# Patient Record
Sex: Female | Born: 1950
Health system: Southern US, Community
[De-identification: ages and names within clinical notes are randomized; demographics above are authoritative.]

## PROBLEM LIST (undated history)

## (undated) DIAGNOSIS — K5792 Diverticulitis of intestine, part unspecified, without perforation or abscess without bleeding: Secondary | ICD-10-CM

## (undated) DIAGNOSIS — J42 Unspecified chronic bronchitis: Secondary | ICD-10-CM

## (undated) DIAGNOSIS — Z720 Tobacco use: Secondary | ICD-10-CM

## (undated) HISTORY — PX: TONSILLECTOMY: SUR1361

## (undated) HISTORY — PX: ABDOMINAL HYSTERECTOMY: SHX81

---

## 2019-08-17 ENCOUNTER — Emergency Department
Admission: EM | Admit: 2019-08-17 | Discharge: 2019-08-17 | Disposition: A | Discharge status: Home / Self Care | Payer: Medicare Other | Attending: Emergency Medicine | Admitting: Emergency Medicine

## 2019-08-17 ENCOUNTER — Emergency Department: Payer: Medicare Other

## 2019-08-17 ENCOUNTER — Other Ambulatory Visit: Payer: Self-pay

## 2019-08-17 DIAGNOSIS — R2241 Localized swelling, mass and lump, right lower limb: Secondary | ICD-10-CM | POA: Diagnosis not present

## 2019-08-17 DIAGNOSIS — F17200 Nicotine dependence, unspecified, uncomplicated: Secondary | ICD-10-CM | POA: Diagnosis not present

## 2019-08-17 DIAGNOSIS — R0602 Shortness of breath: Secondary | ICD-10-CM | POA: Diagnosis not present

## 2019-08-17 DIAGNOSIS — M25561 Pain in right knee: Secondary | ICD-10-CM | POA: Insufficient documentation

## 2019-08-17 LAB — CBC
HCT: 47.3 % — ABNORMAL HIGH (ref 36.0–46.0)
Hemoglobin: 15.6 g/dL — ABNORMAL HIGH (ref 12.0–15.0)
MCH: 30.5 pg (ref 26.0–34.0)
MCHC: 33 g/dL (ref 30.0–36.0)
MCV: 92.6 fL (ref 80.0–100.0)
Platelets: 224 10*3/uL (ref 150–400)
RBC: 5.11 MIL/uL (ref 3.87–5.11)
RDW: 13.3 % (ref 11.5–15.5)
WBC: 7.8 10*3/uL (ref 4.0–10.5)
nRBC: 0 % (ref 0.0–0.2)

## 2019-08-17 LAB — BASIC METABOLIC PANEL
Anion gap: 8 (ref 5–15)
BUN: 12 mg/dL (ref 8–23)
CO2: 31 mmol/L (ref 22–32)
Calcium: 9.1 mg/dL (ref 8.9–10.3)
Chloride: 100 mmol/L (ref 98–111)
Creatinine, Ser: 0.63 mg/dL (ref 0.44–1.00)
GFR calc Af Amer: >60 mL/min (ref >60)
GFR calc non Af Amer: >60 mL/min (ref >60)
Glucose, Bld: 106 mg/dL — ABNORMAL HIGH (ref 70–99)
Potassium: 3.7 mmol/L (ref 3.5–5.1)
Sodium: 139 mmol/L (ref 135–145)

## 2019-08-17 LAB — BRAIN NATRIURETIC PEPTIDE: B Natriuretic Peptide: 54 pg/mL (ref 0.0–100.0)

## 2019-08-17 LAB — TROPONIN I (HIGH SENSITIVITY): Troponin I (High Sensitivity): 17 ng/L (ref <18)

## 2019-08-17 MED ORDER — TRAMADOL HCL 50 MG PO TABS: 50.0000 mg | ORAL_TABLET | Freq: Four times a day (QID) | ORAL | 0 refills | Status: AC | PRN

## 2019-08-17 MED ORDER — FUROSEMIDE 20 MG PO TABS
20.0000 mg | ORAL_TABLET | Freq: Every day | ORAL | 0 refills | Status: AC
Start: 2019-08-17 — End: 2019-08-27

## 2019-08-17 NOTE — Discharge Instructions (Addendum)
You should take ibuprofen (Advil, Motrin) or Tylenol for the knee pain.  You may take the tramadol that was prescribed today if needed for more severe pain.  You should take the furosemide that was prescribed daily for the next 10 days to help with swelling and the shortness of breath.  It is very important that you follow-up with your primary care doctor (we have provided information for [facility] and [facility]).  You should also follow-up with an orthopedist if you continue have pain in the knee.  Return to the ER for new, worsening, or persistent severe pain, swelling, difficulty breathing, weakness, or any other new or worsening symptoms that concern you.

## 2019-08-17 NOTE — ED Triage Notes (Signed)
Pt arrives to ed via pov from home with c/o leg swelling starting last Tuesday, and by Thursday pt states she was unable to bend right leg. Pt denies any injury, or falls. Knee currently covered but reports warmth and swelling, denies any redness. Pt also reports SOB with exertion, reports hx of feeling sob but feels over the last month increased SOB with exertion. Pt a&o x 4, NAD noted at this time. RR even and unlabored at this time.

## 2019-08-17 NOTE — ED Notes (Signed)
Pt alert and oriented X 4, stable for discharge. RR even and unlabored, color WNL. Discussed discharge instructions and follow up when appropriate. Instructed to follow up with ER for any life threatening symptoms or concerns that patient or family of patient may have  

## 2019-08-17 NOTE — ED Provider Notes (Signed)
[facility] Emergency Department Provider Note ____________________________________________   First MD Initiated Contact with Patient 08/17/19 1528     (approximate)  I have reviewed the triage vital signs and the nursing notes.   HISTORY  Chief Complaint Shortness of Breath and Leg Swelling    HPI Brianna Barker is a 69 y.o. female with no significant past medical history who presents with 2 complaints.  Primarily, she reports right knee pain for about the last 6 days.  It started when she turned in bed one night.  It is associated with swelling, and is worse when she tries to bend the knee.  She also reports mild swelling to the left leg which is more chronic.  The patient also complains of shortness of breath, gradual onset over approximately the last month, worse with exertion but not with lying in bed.  The patient mainly sleeps on her side.  She denies associated cough, fever, or chest pain.  She is not on any medications currently other than Tylenol for the knee pain.  History reviewed. No pertinent past medical history.  There are no problems to display for this patient.   Past Surgical History:  Procedure Laterality Date  . ABDOMINAL HYSTERECTOMY    . TONSILLECTOMY      Prior to Admission medications   Medication Sig Start Date End Date Taking? Authorizing Provider  furosemide (LASIX) 20 MG tablet Take 1 tablet (20 mg total) by mouth daily for 10 days. 08/17/19 08/27/19  Arta Silence, MD  traMADol (ULTRAM) 50 MG tablet Take 1 tablet (50 mg total) by mouth every 6 (six) hours as needed for up to 5 days for moderate pain. 08/17/19 08/22/19  Arta Silence, MD    Allergies Aleve [naproxen]  History reviewed. No pertinent family history.  Social History Social History   Tobacco Use  . Smoking status: Current Every Day Smoker  . Smokeless tobacco: Never Used  Substance Use Topics  . Alcohol use: Not Currently  . Drug use: Never     Review of Systems  Constitutional: No fever. Eyes: No redness. ENT: No sore throat. Cardiovascular: Denies chest pain. Respiratory: Positive for shortness of breath. Gastrointestinal: No vomiting or diarrhea.  Genitourinary: Negative for dysuria.  Musculoskeletal: Negative for back pain.  Positive for right knee pain. Skin: Negative for rash. Neurological: Negative for headaches, focal weakness or numbness.   ____________________________________________   PHYSICAL EXAM:  VITAL SIGNS: ED Triage Vitals  Enc Vitals Group     BP 08/17/19 1436 (!) 171/82     Pulse Rate 08/17/19 1436 88     Resp 08/17/19 1436 18     Temp 08/17/19 1436 98 F (36.7 C)     Temp Source 08/17/19 1436 Oral     SpO2 08/17/19 1436 94 %     Weight 08/17/19 1441 160 lb (72.6 kg)     Height 08/17/19 1441 5' (1.524 m)     Head Circumference --      Peak Flow --      Pain Score 08/17/19 1441 4     Pain Loc --      Pain Edu? --      Excl. in [place]? --     Constitutional: Alert and oriented. Well appearing and in no acute distress. Eyes: Conjunctivae are normal.  Head: Atraumatic. Nose: No congestion/rhinnorhea. Mouth/Throat: Mucous membranes are moist.   Neck: Normal range of motion.  Cardiovascular: Normal rate, regular rhythm. Grossly normal heart sounds.  Good peripheral  circulation. Respiratory: Normal respiratory effort.  No retractions.  Scattered wheezing bilaterally. Gastrointestinal: No distention.  Musculoskeletal: Trace left lower extremity edema.  1+ right lower extremity edema below the knee.  Moderate swelling to the knee and mild tenderness posteriorly.  Range of motion of the right knee limited due to pain.  Full extension of the knee intact.  Extremities warm and well perfused.  No erythema, induration, or abnormal warmth. Neurologic:  Normal speech and language.  5/5 motor strength and intact sensation of bilateral lower extremities.   Skin:  Skin is warm and dry. No rash  noted. Psychiatric: Mood and affect are normal. Speech and behavior are normal.  ____________________________________________   LABS (all labs ordered are listed, but only abnormal results are displayed)  Labs Reviewed  BASIC METABOLIC PANEL - Abnormal; Notable for the following components:      Result Value   Glucose, Bld 106 (*)    All other components within normal limits  CBC - Abnormal; Notable for the following components:   Hemoglobin 15.6 (*)    HCT 47.3 (*)    All other components within normal limits  BRAIN NATRIURETIC PEPTIDE  TROPONIN I (HIGH SENSITIVITY)   ____________________________________________  EKG  ED ECG REPORT I, Dionne Bucy, the attending physician, personally viewed and interpreted this ECG.  Date: 08/17/2019 EKG Time: 1451 Rate: 85 Rhythm: normal sinus rhythm with occasional PVCs QRS Axis: normal Intervals: normal ST/T Wave abnormalities: Nonspecific T wave abnormalities Narrative Interpretation: Nonspecific abnormalities with no evidence of acute ischemia  ____________________________________________  RADIOLOGY  CXR: Interstitial opacities bilateral lung bases XR R knee: No acute fracture US venous RLE: No acute DVT.  Decompressed Baker's cyst.  ____________________________________________   PROCEDURES  Procedure(s) performed: No  Procedures  Critical Care performed: No ____________________________________________   INITIAL IMPRESSION / ASSESSMENT AND PLAN / ED COURSE  Pertinent labs & imaging results that were available during my care of the patient were reviewed by me and considered in my medical decision making (see chart for details).  69 year old female with no active medical problems that she is aware of presents with right knee pain over the last week after she feels like she pulled or strained it during the night, as well as shortness of breath over approximately 1 month.  On exam the patient is overall  well-appearing.  Her vital signs are normal except for hypertension.  O2 saturation is in the mid 90s on room air.  She has some tenderness to the right knee especially posteriorly and range of motion limited due to pain, but no cutaneous findings.  There is mild swelling, somewhat worse on the right.  Knee pain: Presentation is most consistent with meniscus tear, sprain, muscle strain, or other musculoskeletal etiology.  We will obtain x-rays, and given the more diffuse swelling in the distal right leg we will obtain an ultrasound to rule out DVT.  Shortness of breath: This is more subacute and gradual.  Differential includes new onset CHF, new onset COPD/asthma, bronchitis, or less likely pneumonia.  We will obtain a chest x-ray and labs.  ----------------------------------------- 5:32 PM on 08/17/2019 -----------------------------------------  X-ray of the knee shows no acute bony findings.  Ultrasound shows no DVT, but there is a small decompressed Baker's cyst.  This and/or torn meniscus are the most likely etiologies of the patient's pain.  I instructed her to take ibuprofen or Tylenol and will prescribe tramadol for additional pain relief.  I have given referral to orthopedics.  Chest x-ray shows  bilateral mild interstitial opacity, which given the patient's subacute symptoms is most consistent with mild edema rather than an infectious cause.  Lab work-up is unremarkable, and there is no evidence of acute CHF.  The patient will need further outpatient work-up.  I have prescribed 10 days of a low-dose of Lasix in the interim.  I have given the patient primary care referral.  The patient is stable for discharge at this time.  Return precautions given, and she expressed understanding. ____________________________________________   FINAL CLINICAL IMPRESSION(S) / ED DIAGNOSES  Final diagnoses:  Acute pain of right knee  Shortness of breath      NEW MEDICATIONS STARTED DURING THIS  VISIT:  New Prescriptions   FUROSEMIDE (LASIX) 20 MG TABLET    Take 1 tablet (20 mg total) by mouth daily for 10 days.   TRAMADOL (ULTRAM) 50 MG TABLET    Take 1 tablet (50 mg total) by mouth every 6 (six) hours as needed for up to 5 days for moderate pain.     Note:  This document was prepared using Dragon voice recognition software and may include unintentional dictation errors.    Dionne Bucy, MD 08/17/19 1734

## 2019-09-08 ENCOUNTER — Ambulatory Visit: Payer: Self-pay | Attending: Internal Medicine

## 2019-09-08 DIAGNOSIS — Z23 Encounter for immunization: Secondary | ICD-10-CM

## 2019-09-08 NOTE — Progress Notes (Signed)
   Covid-19 Vaccination Clinic  Name:  Averianna Brugger    MRN: [id number] DOB: July 27, 1950  09/08/2019  Ms. Monica was observed post Covid-19 immunization for 15 minutes without incident. She was provided with Vaccine Information Sheet and instruction to access the V-Safe system.   Ms. Birkhead was instructed to call 911 with any severe reactions post vaccine: Marland Kitchen Difficulty breathing  . Swelling of face and throat  . A fast heartbeat  . A bad rash all over body  . Dizziness and weakness   Immunizations Administered    Name Date Dose VIS Date Route   Pfizer COVID-19 Vaccine 09/08/2019  6:17 PM 0.3 mL 06/10/2018 Intramuscular   Manufacturer: [employer], [employer]   Lot: [id number]   NDC: [address]-9

## 2019-09-29 ENCOUNTER — Ambulatory Visit: Payer: Medicare Other | Attending: Internal Medicine

## 2019-09-29 DIAGNOSIS — Z23 Encounter for immunization: Secondary | ICD-10-CM

## 2019-09-29 NOTE — Progress Notes (Signed)
   Covid-19 Vaccination Clinic  Name:  Brianna Barker    MRN: [id number] DOB: [birth year]  09/29/2019  [name] was observed post Covid-19 immunization for 15 minutes without incident. She was provided with Vaccine Information Sheet and instruction to access the V-Safe system.   [name] was instructed to call 911 with any severe reactions post vaccine: Marland Kitchen Difficulty breathing  . Swelling of face and throat  . A fast heartbeat  . A bad rash all over body  . Dizziness and weakness   Immunizations Administered    Name Date Dose VIS Date Route   Pfizer COVID-19 Vaccine 09/29/2019  4:59 PM 0.3 mL 06/10/2018 Intramuscular   Manufacturer: [employer], [employer]   Lot: [id number]   NDC: [address]-9

## 2020-06-19 ENCOUNTER — Emergency Department: Payer: Medicare HMO

## 2020-06-19 ENCOUNTER — Encounter: Payer: Self-pay | Admitting: Intensive Care

## 2020-06-19 ENCOUNTER — Other Ambulatory Visit: Payer: Self-pay

## 2020-06-19 DIAGNOSIS — J90 Pleural effusion, not elsewhere classified: Secondary | ICD-10-CM | POA: Diagnosis not present

## 2020-06-19 DIAGNOSIS — Z886 Allergy status to analgesic agent status: Secondary | ICD-10-CM | POA: Diagnosis not present

## 2020-06-19 DIAGNOSIS — D72829 Elevated white blood cell count, unspecified: Secondary | ICD-10-CM | POA: Diagnosis present

## 2020-06-19 DIAGNOSIS — Z833 Family history of diabetes mellitus: Secondary | ICD-10-CM

## 2020-06-19 DIAGNOSIS — J9601 Acute respiratory failure with hypoxia: Secondary | ICD-10-CM | POA: Diagnosis not present

## 2020-06-19 DIAGNOSIS — I959 Hypotension, unspecified: Secondary | ICD-10-CM | POA: Diagnosis not present

## 2020-06-19 DIAGNOSIS — Z808 Family history of malignant neoplasm of other organs or systems: Secondary | ICD-10-CM | POA: Diagnosis not present

## 2020-06-19 DIAGNOSIS — J449 Chronic obstructive pulmonary disease, unspecified: Secondary | ICD-10-CM | POA: Diagnosis present

## 2020-06-19 DIAGNOSIS — F17210 Nicotine dependence, cigarettes, uncomplicated: Secondary | ICD-10-CM | POA: Diagnosis present

## 2020-06-19 DIAGNOSIS — R0902 Hypoxemia: Secondary | ICD-10-CM

## 2020-06-19 DIAGNOSIS — Z90710 Acquired absence of both cervix and uterus: Secondary | ICD-10-CM | POA: Diagnosis not present

## 2020-06-19 DIAGNOSIS — I214 Non-ST elevation (NSTEMI) myocardial infarction: Principal | ICD-10-CM | POA: Diagnosis present

## 2020-06-19 DIAGNOSIS — R0602 Shortness of breath: Secondary | ICD-10-CM | POA: Diagnosis not present

## 2020-06-19 DIAGNOSIS — Z20822 Contact with and (suspected) exposure to covid-19: Secondary | ICD-10-CM | POA: Diagnosis present

## 2020-06-19 DIAGNOSIS — E785 Hyperlipidemia, unspecified: Secondary | ICD-10-CM | POA: Diagnosis not present

## 2020-06-19 DIAGNOSIS — I5031 Acute diastolic (congestive) heart failure: Secondary | ICD-10-CM | POA: Diagnosis present

## 2020-06-19 DIAGNOSIS — I249 Acute ischemic heart disease, unspecified: Secondary | ICD-10-CM | POA: Diagnosis not present

## 2020-06-19 DIAGNOSIS — I469 Cardiac arrest, cause unspecified: Secondary | ICD-10-CM | POA: Diagnosis not present

## 2020-06-19 DIAGNOSIS — Z79899 Other long term (current) drug therapy: Secondary | ICD-10-CM

## 2020-06-19 DIAGNOSIS — E871 Hypo-osmolality and hyponatremia: Secondary | ICD-10-CM | POA: Diagnosis not present

## 2020-06-19 DIAGNOSIS — Z9189 Other specified personal risk factors, not elsewhere classified: Secondary | ICD-10-CM | POA: Diagnosis not present

## 2020-06-19 DIAGNOSIS — I5033 Acute on chronic diastolic (congestive) heart failure: Secondary | ICD-10-CM | POA: Diagnosis not present

## 2020-06-19 DIAGNOSIS — R451 Restlessness and agitation: Secondary | ICD-10-CM | POA: Diagnosis not present

## 2020-06-19 DIAGNOSIS — Z452 Encounter for adjustment and management of vascular access device: Secondary | ICD-10-CM | POA: Diagnosis not present

## 2020-06-19 DIAGNOSIS — J9602 Acute respiratory failure with hypercapnia: Secondary | ICD-10-CM | POA: Diagnosis not present

## 2020-06-19 DIAGNOSIS — Z8249 Family history of ischemic heart disease and other diseases of the circulatory system: Secondary | ICD-10-CM

## 2020-06-19 DIAGNOSIS — I080 Rheumatic disorders of both mitral and aortic valves: Secondary | ICD-10-CM | POA: Diagnosis present

## 2020-06-19 DIAGNOSIS — I252 Old myocardial infarction: Secondary | ICD-10-CM

## 2020-06-19 DIAGNOSIS — J439 Emphysema, unspecified: Secondary | ICD-10-CM | POA: Diagnosis present

## 2020-06-19 DIAGNOSIS — R0989 Other specified symptoms and signs involving the circulatory and respiratory systems: Secondary | ICD-10-CM | POA: Diagnosis not present

## 2020-06-19 DIAGNOSIS — Z720 Tobacco use: Secondary | ICD-10-CM | POA: Diagnosis not present

## 2020-06-19 DIAGNOSIS — F17200 Nicotine dependence, unspecified, uncomplicated: Secondary | ICD-10-CM | POA: Diagnosis not present

## 2020-06-19 DIAGNOSIS — I2510 Atherosclerotic heart disease of native coronary artery without angina pectoris: Secondary | ICD-10-CM | POA: Diagnosis present

## 2020-06-19 DIAGNOSIS — E872 Acidosis: Secondary | ICD-10-CM | POA: Diagnosis not present

## 2020-06-19 DIAGNOSIS — I509 Heart failure, unspecified: Secondary | ICD-10-CM | POA: Diagnosis not present

## 2020-06-19 DIAGNOSIS — I083 Combined rheumatic disorders of mitral, aortic and tricuspid valves: Secondary | ICD-10-CM | POA: Diagnosis present

## 2020-06-19 DIAGNOSIS — Z6831 Body mass index (BMI) 31.0-31.9, adult: Secondary | ICD-10-CM

## 2020-06-19 DIAGNOSIS — D72825 Bandemia: Secondary | ICD-10-CM | POA: Diagnosis not present

## 2020-06-19 DIAGNOSIS — Z801 Family history of malignant neoplasm of trachea, bronchus and lung: Secondary | ICD-10-CM | POA: Diagnosis not present

## 2020-06-19 DIAGNOSIS — R030 Elevated blood-pressure reading, without diagnosis of hypertension: Secondary | ICD-10-CM | POA: Diagnosis present

## 2020-06-19 DIAGNOSIS — R079 Chest pain, unspecified: Secondary | ICD-10-CM | POA: Diagnosis present

## 2020-06-19 HISTORY — DX: Diverticulitis of intestine, part unspecified, without perforation or abscess without bleeding: K57.92

## 2020-06-19 HISTORY — DX: Morbid (severe) obesity due to excess calories: E66.01

## 2020-06-19 HISTORY — DX: Tobacco use: Z72.0

## 2020-06-19 HISTORY — DX: Unspecified chronic bronchitis: J42

## 2020-06-19 LAB — BASIC METABOLIC PANEL
Anion gap: 10 (ref 5–15)
BUN: 14 mg/dL (ref 8–23)
CO2: 28 mmol/L (ref 22–32)
Calcium: 9.2 mg/dL (ref 8.9–10.3)
Chloride: 95 mmol/L — ABNORMAL LOW (ref 98–111)
Creatinine, Ser: 0.61 mg/dL (ref 0.44–1.00)
GFR, Estimated: >60 mL/min (ref >60)
Glucose, Bld: 121 mg/dL — ABNORMAL HIGH (ref 70–99)
Potassium: 4.4 mmol/L (ref 3.5–5.1)
Sodium: 133 mmol/L — ABNORMAL LOW (ref 135–145)

## 2020-06-19 LAB — MRSA PCR SCREENING: MRSA by PCR: NEGATIVE

## 2020-06-19 LAB — BRAIN NATRIURETIC PEPTIDE: B Natriuretic Peptide: 322.4 pg/mL — ABNORMAL HIGH (ref 0.0–100.0)

## 2020-06-19 LAB — TROPONIN I (HIGH SENSITIVITY)
Troponin I (High Sensitivity): 24307 ng/L (ref <18)
Troponin I (High Sensitivity): >27000 ng/L (ref <18)
Troponin I (High Sensitivity): >27000 ng/L (ref <18)

## 2020-06-19 LAB — CBC
HCT: 49.7 % — ABNORMAL HIGH (ref 36.0–46.0)
Hemoglobin: 16.7 g/dL — ABNORMAL HIGH (ref 12.0–15.0)
MCH: 30.3 pg (ref 26.0–34.0)
MCHC: 33.6 g/dL (ref 30.0–36.0)
MCV: 90.2 fL (ref 80.0–100.0)
Platelets: 261 10*3/uL (ref 150–400)
RBC: 5.51 MIL/uL — ABNORMAL HIGH (ref 3.87–5.11)
RDW: 12.5 % (ref 11.5–15.5)
WBC: 17 10*3/uL — ABNORMAL HIGH (ref 4.0–10.5)
nRBC: 0 % (ref 0.0–0.2)

## 2020-06-19 LAB — RESP PANEL BY RT-PCR (FLU A&B, COVID) ARPGX2
Influenza A by PCR: NEGATIVE
Influenza B by PCR: NEGATIVE
SARS Coronavirus 2 by RT PCR: NEGATIVE

## 2020-06-19 LAB — GLUCOSE, CAPILLARY
Glucose-Capillary: 109 mg/dL — ABNORMAL HIGH (ref 70–99)
Glucose-Capillary: 133 mg/dL — ABNORMAL HIGH (ref 70–99)

## 2020-06-19 LAB — D-DIMER, QUANTITATIVE: D-Dimer, Quant: 0.41 ug/mL-FEU (ref 0.00–0.50)

## 2020-06-19 LAB — HEPARIN LEVEL (UNFRACTIONATED): Heparin Unfractionated: 0.26 IU/mL — ABNORMAL LOW (ref 0.30–0.70)

## 2020-06-19 MED ORDER — NICOTINE 21 MG/24HR TD PT24
21.0000 mg | MEDICATED_PATCH | Freq: Every day | TRANSDERMAL | Status: DC
  Administered 2020-06-19 – 2020-06-21 (×3): 21 mg via TRANSDERMAL
  Filled 2020-06-19 (×3): qty 1

## 2020-06-19 MED ORDER — LORAZEPAM 2 MG/ML IJ SOLN: 1.0000 mg | Freq: Once | INTRAMUSCULAR | Status: DC

## 2020-06-19 MED ORDER — SODIUM CHLORIDE 0.9 % IV SOLN: INTRAVENOUS | Status: DC

## 2020-06-19 MED ORDER — HEPARIN (PORCINE) 25000 UT/250ML-% IV SOLN
950.0000 [IU]/h | INTRAVENOUS | Status: DC
  Administered 2020-06-19: 14:00:00 750 [IU]/h via INTRAVENOUS
  Filled 2020-06-19: qty 250

## 2020-06-19 MED ORDER — ASPIRIN 300 MG RE SUPP: 300.0000 mg | RECTAL | Status: DC

## 2020-06-19 MED ORDER — ASPIRIN 81 MG PO CHEW: 324.0000 mg | CHEWABLE_TABLET | ORAL | Status: DC

## 2020-06-19 MED ORDER — MORPHINE SULFATE (PF) 2 MG/ML IV SOLN
2.0000 mg | INTRAVENOUS | Status: DC | PRN
  Administered 2020-06-19 – 2020-06-21 (×4): 2 mg via INTRAVENOUS
  Filled 2020-06-19 (×2): qty 1

## 2020-06-19 MED ORDER — HEPARIN BOLUS VIA INFUSION
4000.0000 [IU] | Freq: Once | INTRAVENOUS | Status: AC
  Administered 2020-06-19: 4000 [IU] via INTRAVENOUS
  Filled 2020-06-19: qty 4000

## 2020-06-19 MED ORDER — ATORVASTATIN CALCIUM 20 MG PO TABS
80.0000 mg | ORAL_TABLET | Freq: Every day | ORAL | Status: DC
  Administered 2020-06-19 – 2020-06-21 (×3): 80 mg via ORAL
  Filled 2020-06-19 (×3): qty 4

## 2020-06-19 MED ORDER — NITROGLYCERIN IN D5W 200-5 MCG/ML-% IV SOLN
0.0000 ug/min | INTRAVENOUS | Status: DC
  Administered 2020-06-19: 5 ug/min via INTRAVENOUS
  Filled 2020-06-19: qty 250

## 2020-06-19 MED ORDER — SODIUM CHLORIDE 0.9 % IV SOLN: 250.0000 mL | INTRAVENOUS | Status: DC | PRN

## 2020-06-19 MED ORDER — NITROGLYCERIN 0.4 MG SL SUBL: 0.4000 mg | SUBLINGUAL_TABLET | SUBLINGUAL | Status: DC | PRN

## 2020-06-19 MED ORDER — SODIUM CHLORIDE 0.9% FLUSH
3.0000 mL | Freq: Two times a day (BID) | INTRAVENOUS | Status: DC
  Administered 2020-06-20 – 2020-06-21 (×2): 3 mL via INTRAVENOUS

## 2020-06-19 MED ORDER — ORAL CARE MOUTH RINSE
15.0000 mL | Freq: Two times a day (BID) | OROMUCOSAL | Status: DC
  Administered 2020-06-19 – 2020-06-21 (×4): 15 mL via OROMUCOSAL

## 2020-06-19 MED ORDER — SODIUM CHLORIDE 0.9% FLUSH
3.0000 mL | INTRAVENOUS | Status: DC | PRN
  Administered 2020-06-19: 3 mL via INTRAVENOUS

## 2020-06-19 MED ORDER — CARVEDILOL 6.25 MG PO TABS: 6.2500 mg | ORAL_TABLET | Freq: Two times a day (BID) | ORAL | Status: DC

## 2020-06-19 MED ORDER — FUROSEMIDE 10 MG/ML IJ SOLN
40.0000 mg | Freq: Once | INTRAMUSCULAR | Status: AC
  Administered 2020-06-19: 40 mg via INTRAVENOUS
  Filled 2020-06-19: qty 4

## 2020-06-19 MED ORDER — ASPIRIN 81 MG PO CHEW
81.0000 mg | CHEWABLE_TABLET | ORAL | Status: AC
  Administered 2020-06-20: 81 mg via ORAL
  Filled 2020-06-19: qty 1

## 2020-06-19 MED ORDER — ACETAMINOPHEN 325 MG PO TABS
650.0000 mg | ORAL_TABLET | ORAL | Status: DC | PRN
  Administered 2020-06-19: 650 mg via ORAL
  Filled 2020-06-19: qty 2

## 2020-06-19 MED ORDER — MORPHINE SULFATE (PF) 2 MG/ML IV SOLN
INTRAVENOUS | Status: AC
  Filled 2020-06-19: qty 1

## 2020-06-19 MED ORDER — NITROGLYCERIN 0.4 MG SL SUBL
0.4000 mg | SUBLINGUAL_TABLET | SUBLINGUAL | Status: DC | PRN
  Administered 2020-06-19 (×2): 0.4 mg via SUBLINGUAL
  Filled 2020-06-19: qty 1

## 2020-06-19 MED ORDER — ONDANSETRON HCL 4 MG/2ML IJ SOLN
4.0000 mg | Freq: Four times a day (QID) | INTRAMUSCULAR | Status: DC | PRN
  Administered 2020-06-19 – 2020-06-21 (×6): 4 mg via INTRAVENOUS
  Filled 2020-06-19 (×5): qty 2

## 2020-06-19 MED ORDER — PROMETHAZINE HCL 25 MG/ML IJ SOLN
12.5000 mg | Freq: Once | INTRAMUSCULAR | Status: AC
  Administered 2020-06-19: 12.5 mg via INTRAVENOUS
  Filled 2020-06-19: qty 1

## 2020-06-19 MED ORDER — ASPIRIN 81 MG PO CHEW
324.0000 mg | CHEWABLE_TABLET | Freq: Once | ORAL | Status: AC
  Administered 2020-06-19: 324 mg via ORAL
  Filled 2020-06-19: qty 4

## 2020-06-19 MED ORDER — CARVEDILOL 3.125 MG PO TABS
3.1250 mg | ORAL_TABLET | Freq: Two times a day (BID) | ORAL | Status: DC
  Administered 2020-06-19: 3.125 mg via ORAL
  Filled 2020-06-19 (×2): qty 1

## 2020-06-19 MED ORDER — HEPARIN BOLUS VIA INFUSION
900.0000 [IU] | Freq: Once | INTRAVENOUS | Status: AC
  Administered 2020-06-19: 900 [IU] via INTRAVENOUS
  Filled 2020-06-19: qty 900

## 2020-06-19 MED ORDER — ASPIRIN EC 81 MG PO TBEC
81.0000 mg | DELAYED_RELEASE_TABLET | Freq: Every day | ORAL | Status: DC
  Administered 2020-06-20 – 2020-06-21 (×2): 81 mg via ORAL
  Filled 2020-06-19 (×2): qty 1

## 2020-06-19 MED ORDER — IPRATROPIUM-ALBUTEROL 0.5-2.5 (3) MG/3ML IN SOLN
3.0000 mL | Freq: Four times a day (QID) | RESPIRATORY_TRACT | Status: DC | PRN
  Administered 2020-06-21: 3 mL via RESPIRATORY_TRACT
  Filled 2020-06-19: qty 3

## 2020-06-19 NOTE — Progress Notes (Signed)
Pt saturation oxygen increased from 3L to6L. at 3L, she was 84% and sustaining. 6L now now 90-91% FYI, oxygen level sustaining at 88-90%, pt restless but denies any CP, stated nauseated and air hunger at this time.    I have given her Zofran, Tylenol for Headaches and she continued to be restless and air hunger , I have given her morphine 2mg  and she is now 100%NRB, resting and oxygen saturation is 99%. NP notified and will cont to monitor.

## 2020-06-19 NOTE — H&P (Addendum)
History and Physical    Brianna Barker ZOX:[id number] DOB: 04-25-1950 DOA: 07/14/2020  PCP: Patient, No Pcp Per  Patient coming from: home  I have personally briefly reviewed patient's old medical records in [place]  Chief Complaint: chest pain x 24 hours   HPI: Brianna Barker is a 70 y.o. female with medical history significant of chronic bronchitis, tobacco abuse (x 60 years),? history of heart failure diagnosed one year ago but was lost to follow up. Patient, now presents to ED with 24 hours of persistent chest pain, described as substernal heaviness associated with nausea, shortness of breath, hot and cold flashes, as well as loose stools. She notes symptoms are made worse with exertion. She states she has had low appetite and has not eaten since yesterday morning. Patient also endorses episodes of chest pressure over the past few years c but these episodes were short lived and not severe.  On further ROS patient also endorses worsening orthopnea since last pm. Currently, she notes he pain is a 3/10.   ED Course:  Afeb, bp 159/103, hr 102, rr26 Sat 90% on ra Labs: Wbc:17, hgb 16.7, plt261 Ce:24,307 Labs:NA133, K4.4, CL95, glu121, cr 0.61 EKG:nsr with nonspecific st,twave changes anterior lateral leads, no hyperacute findings  WJX:BJYN pul edema  Respiratory panel:negative tx asa 325 x1 ,nitro sl x 2, heparin  Review of Systems: no presyncope,no weight loss, no current blood in stools or black stools otherwise As per HPI otherwise 10 point review of systems negative.   Past Medical History:  Diagnosis Date  . Chronic bronchitis ([place])   . Diverticulitis     Past Surgical History:  Procedure Laterality Date  . ABDOMINAL HYSTERECTOMY    . TONSILLECTOMY       reports that she has been smoking cigarettes. She has never used smokeless tobacco. She reports previous alcohol use. She reports that she does not use drugs.   FH: Mom: MI early 26's with CABG, DMII  DAD: Died  from [place] in early 31's ,BRCA Brother : MI died at 66  Family member: Lung,brain cancer  Allergies  Allergen Reactions  . Aleve [Naproxen] Itching and Rash    History reviewed. No pertinent family history.  Prior to Admission medications   Medication Sig Start Date End Date Taking? Authorizing Provider  furosemide (LASIX) 20 MG tablet Take 1 tablet (20 mg total) by mouth daily for 10 days. 08/17/19 08/27/19  Arta Silence, MD    Physical Exam: Vitals:   07/05/2020 1345 07/10/2020 1400 07/03/2020 1420 07/12/2020 1440  BP: (!) 158/82 (!) 130/96 135/78 (!) 137/97  Pulse: (!) 109 (!) 108 99 99  Resp: (!) 27 (!) 22 (!) 24 (!) 24  Temp:      TempSrc:      SpO2: 94% 94% 95% 97%  Weight:      Height:         Vitals:   07/03/2020 1345 06/17/2020 1400 06/16/2020 1420 07/13/2020 1440  BP: (!) 158/82 (!) 130/96 135/78 (!) 137/97  Pulse: (!) 109 (!) 108 99 99  Resp: (!) 27 (!) 22 (!) 24 (!) 24  Temp:      TempSrc:      SpO2: 94% 94% 95% 97%  Weight:      Height:      Constitutional: NAD, calm, comfortable Eyes: PERRL, lids and conjunctivae normal ENMT: Mucous membranes are moist. Posterior pharynx clear of any exudate or lesions.poor dentition.  Neck: normal, supple, no masses, no thyromegaly Respiratory: clear to  auscultation bilaterally, although diminished throughout, no wheezing, no crackles. Normal respiratory effort. No accessory muscle use.  Cardiovascular: Regular rate and rhythm, no murmurs / rubs / gallops. No extremity edema. 2+ pedal pulses. No carotid bruits.  Abdomen: obese,no tenderness, no masses palpated. No hepatosplenomegaly. Bowel sounds positive.  Musculoskeletal: no clubbing / cyanosis. No joint deformity upper and lower extremities. Good ROM, no contractures. Normal muscle tone.  Skin: no rashes, lesions, ulcers. No induration Neurologic: CN 2-12 grossly intact. Sensation intact,  Strength 5/5 in all 4.  Psychiatric: Normal judgment and insight. Alert and oriented x 3.  Normal mood.    Labs on Admission: I have personally reviewed following labs and imaging studies  CBC: Recent Labs  Lab 06/24/2020 1235  WBC 17.0*  HGB 16.7*  HCT 49.7*  MCV 90.2  PLT 355   Basic Metabolic Panel: Recent Labs  Lab 07/14/2020 1326  NA 133*  K 4.4  CL 95*  CO2 28  GLUCOSE 121*  BUN 14  CREATININE 0.61  CALCIUM 9.2   GFR: Estimated Creatinine Clearance: 58.2 mL/min (by C-G formula based on SCr of 0.61 mg/dL). Liver Function Tests: No results for input(s): AST, ALT, ALKPHOS, BILITOT, PROT, ALBUMIN in the last 168 hours. No results for input(s): LIPASE, AMYLASE in the last 168 hours. No results for input(s): AMMONIA in the last 168 hours. Coagulation Profile: No results for input(s): INR, PROTIME in the last 168 hours. Cardiac Enzymes: No results for input(s): CKTOTAL, CKMB, CKMBINDEX, TROPONINI in the last 168 hours. BNP (last 3 results) No results for input(s): PROBNP in the last 8760 hours. HbA1C: No results for input(s): HGBA1C in the last 72 hours. CBG: No results for input(s): GLUCAP in the last 168 hours. Lipid Profile: No results for input(s): CHOL, HDL, LDLCALC, TRIG, CHOLHDL, LDLDIRECT in the last 72 hours. Thyroid Function Tests: No results for input(s): TSH, T4TOTAL, FREET4, T3FREE, THYROIDAB in the last 72 hours. Anemia Panel: No results for input(s): VITAMINB12, FOLATE, FERRITIN, TIBC, IRON, RETICCTPCT in the last 72 hours. Urine analysis: No results found for: COLORURINE, APPEARANCEUR, LABSPEC, [place], GLUCOSEU, HGBUR, BILIRUBINUR, KETONESUR, PROTEINUR, UROBILINOGEN, NITRITE, LEUKOCYTESUR  Radiological Exams on Admission: DG Chest 2 View  Result Date: 06/29/2020 CLINICAL DATA:  Shortness of breath. EXAM: CHEST - 2 VIEW COMPARISON:  08/17/2019 and prior exams FINDINGS: UPPER limits normal heart size noted. 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 Mild interstitial opacities are now identified. There is no evidence of consolidation, pleural  effusion or pneumothorax. No acute bony abnormalities are present. IMPRESSION: Mild interstitial opacities-question interstitial edema or infection. COPD/emphysema changes. Electronically Signed   By: Margarette Canada M.D.   On: 07/14/2020 13:18    EKG: Independently reviewed. See above  Assessment/Plan NSTEMI -admit to [facility] -place on NSTEMI protocol -asa,statin,bb,heparin drip -echo  -cardiology consult called ,will see patient in ed  -npo /plan for cath in am  -monitor CE per protocol  -nitro drip started due to continued chest pain  New Onset heart failure insetting of above  -lasix 40 x 1  -bnp pending -re-dose base on response  -f/u on echo   Leukocytosis -due to stress response   Mild hyponatremia -?due to fluid overload -check serum osmo, urine NA,monitor labs   Chronic bronchitis /copd -no acute exacerbation  - prn nebs  -will need f/u outpatient with PFTs   Tobacco abuse -encourage cessation  -place education order   DVT prophylaxis: Heparin  Code Status: Full Family Communication: n/a Disposition Plan:patient  expected to be admitted greater than  2 midnights Consults called: Cardiology,[place] MD Admission status:[facility]    Clance Boll MD [facility]   If 7PM-7AM, please contact night-coverage [email or web address] Password [facility]  06/18/2020, 3:05 PM

## 2020-06-19 NOTE — Consult Note (Addendum)
Cardiology Consult    Patient ID: Brianna Barker MRN: [id number], DOB/AGE: 70-Feb-1952   Admit date: 06/23/2020 Date of Consult: June 21, 2020  Primary Physician: Patient, No Pcp Per Primary Cardiologist: New Requesting Provider: S. Maisie Fus, MD  Patient Profile    Floreine Kingdon is a 70 y.o. female with a history of tobacco abuse, obesity, chronic bronchitis, and diverticulitis, who is being seen today for the evaluation of non-STEMI at the request of Dr. Maisie Fus.  Past Medical History   Past Medical History:  Diagnosis Date  . Chronic bronchitis (HCC)   . Diverticulitis   . Morbid obesity (HCC)   . Tobacco abuse     Past Surgical History:  Procedure Laterality Date  . ABDOMINAL HYSTERECTOMY    . TONSILLECTOMY       Allergies  Allergies  Allergen Reactions  . Aleve [Naproxen] Itching and Rash    History of Present Illness    70 year old female with the above past medical history including ongoing tobacco abuse, chronic bronchitis, diverticulitis, and obesity.  She does not routinely seek medical care and takes no medications at home.  She does have a family history of coronary artery disease with a brother dying of a heart attack at age 32, father dying of a heart attack at age 71, and her mother dying in her 24s with a history of heart disease and diabetes.  Patient lives locally with her son and daughter-in-law.  She is sedentary.  She notes that over the past several years, she has experienced intermittent exertional chest heaviness associated with dyspnea.  In that setting, she simply tries to avoid excessive activity.  On Friday, March 4, she had to do the grocery shopping for party on Saturday and after unloading the car, she noted chest heaviness that resolved with rest.  On Saturday, March 5, while eating a donut and a banana for breakfast, she had sudden onset of severe 10/10 substernal chest heaviness associated with nausea, diaphoresis, dyspnea, and vomiting x1.  She had  persistent malaise and chest pressure throughout the afternoon but as her family was celebrating her granddaughters 16th birthday party, she did not want to create a stir, and simply stayed in her room.  She had a very restless night last night and slept very little.  When she got up this morning, her son checked on her and she alerted him to her symptoms and he took her to the [facility] ED.  Here, ECG was notable for sinus tachycardia at 108 with poor R wave progression.  There were no acute ST or T changes.  She was given sublobar nitroglycerin with reduction in chest pain from 10/10 to 3/10.  Blood pressure was elevated at 158/103 on arrival and this has come down to the 130s.  Chest x-ray notable for mild interstitial opacities question edema versus infection, along with COPD.  She was given 1 dose of intravenous Lasix.  Currently, she is resting quietly and is hemodynamically stable.  She continues to report 3/10 chest discomfort.  High-sensitivity troponin is elevated at 24,307.  Inpatient Medications    . [START ON 06/24/2020] aspirin EC  81 mg Oral Daily  . atorvastatin  80 mg Oral Daily  . carvedilol  3.125 mg Oral BID WC  . furosemide  40 mg Intravenous Once    Family History    Family History  Problem Relation Age of Onset  . Heart disease Mother        died @ 40  . Diabetes Mother   .  Peripheral Artery Disease Mother   . Heart attack Father        died @ 32  . Heart attack Brother        died @ 56   She indicated that her mother is deceased. She indicated that her father is deceased. She indicated that her brother is deceased.   Social History    Social History   Socioeconomic History  . Marital status: Single    Spouse name: Not on file  . Number of children: Not on file  . Years of education: Not on file  . Highest education level: Not on file  Occupational History  . Not on file  Tobacco Use  . Smoking status: Current Every Day Smoker    Packs/day: 1.00    Years:  40.00    Pack years: 40.00    Types: Cigarettes  . Smokeless tobacco: Never Used  . Tobacco comment: currently smoking 6 cigarettes/day  Substance and Sexual Activity  . Alcohol use: Not Currently    Comment: rare drink  . Drug use: Never  . Sexual activity: Not on file  Other Topics Concern  . Not on file  Social History Narrative   Lives in [place] with her son and daughter-in-law.  She does not routinely exercise.  She does not have a primary care physician and has not seen a medical provider in many years.   Social Determinants of Health   Financial Resource Strain: Not on file  Food Insecurity: Not on file  Transportation Needs: Not on file  Physical Activity: Not on file  Stress: Not on file  Social Connections: Not on file  Intimate Partner Violence: Not on file     Review of Systems    General:  No chills, fever, night sweats or weight changes.  Cardiovascular:  +++ Several year history of intermittent exertional chest discomfort associated with dyspnea on exertion.  No edema, orthopnea, palpitations, paroxysmal nocturnal dyspnea. Dermatological: No rash, lesions/masses Respiratory: +++ cough, +++ dyspnea Urologic: No hematuria, dysuria Abdominal:   +++ nausea, +++ vomiting, +++ diarrhea, no bright red blood per rectum, melena, or hematemesis Neurologic:  No visual changes, wkns, changes in mental status. All other systems reviewed and are otherwise negative except as noted above.  Physical Exam    Blood pressure (!) 137/97, pulse 99, temperature 98.3 F (36.8 C), temperature source Oral, resp. rate (!) 24, height 5' (1.524 m), weight 72.6 kg, SpO2 97 %.  General: Pleasant, NAD Psych: Normal affect. Neuro: Alert and oriented X 3. Moves all extremities spontaneously. HEENT: Normal  Neck: Supple without bruits or JVD. Lungs:  Resp regular and unlabored, coarse breath sounds with faint inspiratory and expiratory wheezing throughout. Heart: RRR no s3, s4, or  murmurs. Abdomen: Soft, non-tender, non-distended, BS + x 4.  Extremities: No clubbing, cyanosis or edema. DP/PT2+, Radials 2+ and equal bilaterally.  Labs    Cardiac Enzymes Recent Labs  Lab 06/22/2020 1235  TROPONINIHS 24,307*      Lab Results  Component Value Date   WBC 17.0 (H) 07/04/2020   HGB 16.7 (H) 06/24/2020   HCT 49.7 (H) 06/27/2020   MCV 90.2 07/07/2020   PLT 261 06/28/2020    Recent Labs  Lab 07/14/2020 1326  NA 133*  K 4.4  CL 95*  CO2 28  BUN 14  CREATININE 0.61  CALCIUM 9.2  GLUCOSE 121*    Radiology Studies    DG Chest 2 View  Result Date: 06/25/2020 CLINICAL DATA:  Shortness of breath. EXAM: CHEST - 2 VIEW COMPARISON:  08/17/2019 and prior exams FINDINGS: UPPER limits normal heart size noted. 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 Mild interstitial opacities are now identified. There is no evidence of consolidation, pleural effusion or pneumothorax. No acute bony abnormalities are present. IMPRESSION: Mild interstitial opacities-question interstitial edema or infection. COPD/emphysema changes. Electronically Signed   By: Harmon Pier M.D.   On: 06/26/2020 13:18    ECG & Cardiac Imaging    Sinus tachycardia, 108, anterior infarct - personally reviewed.  Assessment & Plan    1.  NSTEMI: Patient without prior history of CAD but with a several year history of intermittent exertional chest heaviness and dyspnea who presented to the emergency department this afternoon with a 24-hour history of severe substernal chest heaviness associated with nausea, vomiting, diaphoresis, and dyspnea.  Symptoms improved dramatically with sublingual nitroglycerin x1.  ECG shows sinus tachycardia with prior anterior infarct but no acute ST or T changes.  Chest pain currently rated 3/10.  Heparin has been started and she is already received aspirin.  Beta-blocker and high potency statin therapy ordered.  We Yanni Quiroa add IV nitroglycerin given ongoing, mild chest discomfort.  We Lizzett Nobile plan for  diagnostic catheterization tomorrow.  The patient understands that risks include but are not limited to stroke (1 in 1000), death (1 in 1000), kidney failure [usually temporary] (1 in 500), bleeding (1 in 200), allergic reaction [possibly serious] (1 in 200), and agrees to proceed.  Patient understands that if she has worsening symptoms, she is to alert staff as we would potentially need to activate cath team today.  Echo ordered.  2.  Lipids:  Status unknown.  Meily Glowacki add high potency statin in setting of ACS and f/u lipids/lfts.  3.  Tobacco abuse/COPD: Smoking cessation advised.  She is wheezing some on exam.  Defer inhaler therapy to primary team.  4.  Leukocytosis: Likely reactive in the setting of #1.  She is afebrile and respiratory screen is negative.  Signed, Nicolasa Ducking, NP 07/01/2020, 4:15 PM  For questions or updates, please contact   Please consult [email or web address] for contact info under Cardiology/STEMI.   I have seen and examined this patient with Ward Givens.  Agree with above, note added to reflect my findings.  On exam, RRR, no murmurs, lungs clear. Patient presented with around 24 hours of chest pain. Troponin significantly elevated, >24K. Patient with nonstemi. Received NTG with improvement in chest pain. On heparin ggt currently. Meryem Haertel start high dose statin and IV NTG. Bert Ptacek plan for [facility] tomorrow.  Chuck Caban M. Terence Googe MD 06/24/2020 6:41 AM

## 2020-06-19 NOTE — H&P (View-Only) (Signed)
Cardiology Consult    Patient ID: Brianna Barker MRN: [id number], DOB/AGE: 70-Feb-1952   Admit date: 06/23/2020 Date of Consult: June 21, 2020  Primary Physician: Patient, No Pcp Per Primary Cardiologist: New Requesting Provider: S. Maisie Fus, MD  Patient Profile    Brianna Barker is a 70 y.o. female with a history of tobacco abuse, obesity, chronic bronchitis, and diverticulitis, who is being seen today for the evaluation of non-STEMI at the request of Dr. Maisie Barker.  Past Medical History   Past Medical History:  Diagnosis Date  . Chronic bronchitis (HCC)   . Diverticulitis   . Morbid obesity (HCC)   . Tobacco abuse     Past Surgical History:  Procedure Laterality Date  . ABDOMINAL HYSTERECTOMY    . TONSILLECTOMY       Allergies  Allergies  Allergen Reactions  . Aleve [Naproxen] Itching and Rash    History of Present Illness    70 year old female with the above past medical history including ongoing tobacco abuse, chronic bronchitis, diverticulitis, and obesity.  She does not routinely seek medical care and takes no medications at home.  She does have a family history of coronary artery disease with a brother dying of a heart attack at age 32, father dying of a heart attack at age 71, and her mother dying in her 24s with a history of heart disease and diabetes.  Patient lives locally with her son and daughter-in-law.  She is sedentary.  She notes that over the past several years, she has experienced intermittent exertional chest heaviness associated with dyspnea.  In that setting, she simply tries to avoid excessive activity.  On Friday, March 4, she had to do the grocery shopping for party on Saturday and after unloading the car, she noted chest heaviness that resolved with rest.  On Saturday, March 5, while eating a donut and a banana for breakfast, she had sudden onset of severe 10/10 substernal chest heaviness associated with nausea, diaphoresis, dyspnea, and vomiting x1.  She had  persistent malaise and chest pressure throughout the afternoon but as her family was celebrating her granddaughters 16th birthday party, she did not want to create a stir, and simply stayed in her room.  She had a very restless night last night and slept very little.  When she got up this morning, her son checked on her and she alerted him to her symptoms and he took her to the [facility] ED.  Here, ECG was notable for sinus tachycardia at 108 with poor R wave progression.  There were no acute ST or T changes.  She was given sublobar nitroglycerin with reduction in chest pain from 10/10 to 3/10.  Blood pressure was elevated at 158/103 on arrival and this has come down to the 130s.  Chest x-ray notable for mild interstitial opacities question edema versus infection, along with COPD.  She was given 1 dose of intravenous Lasix.  Currently, she is resting quietly and is hemodynamically stable.  She continues to report 3/10 chest discomfort.  High-sensitivity troponin is elevated at 24,307.  Inpatient Medications    . [START ON 06/24/2020] aspirin EC  81 mg Oral Daily  . atorvastatin  80 mg Oral Daily  . carvedilol  3.125 mg Oral BID WC  . furosemide  40 mg Intravenous Once    Family History    Family History  Problem Relation Age of Onset  . Heart disease Mother        died @ 40  . Diabetes Mother   .  Peripheral Artery Disease Mother   . Heart attack Father        died @ 76  . Heart attack Brother        died @ 29   She indicated that her mother is deceased. She indicated that her father is deceased. She indicated that her brother is deceased.   Social History    Social History   Socioeconomic History  . Marital status: Single    Spouse name: Not on file  . Number of children: Not on file  . Years of education: Not on file  . Highest education level: Not on file  Occupational History  . Not on file  Tobacco Use  . Smoking status: Current Every Day Smoker    Packs/day: 1.00    Years:  40.00    Pack years: 40.00    Types: Cigarettes  . Smokeless tobacco: Never Used  . Tobacco comment: currently smoking 6 cigarettes/day  Substance and Sexual Activity  . Alcohol use: Not Currently    Comment: rare drink  . Drug use: Never  . Sexual activity: Not on file  Other Topics Concern  . Not on file  Social History Narrative   Lives in [place] with her son and daughter-in-law.  She does not routinely exercise.  She does not have a primary care physician and has not seen a medical provider in many years.   Social Determinants of Health   Financial Resource Strain: Not on file  Food Insecurity: Not on file  Transportation Needs: Not on file  Physical Activity: Not on file  Stress: Not on file  Social Connections: Not on file  Intimate Partner Violence: Not on file     Review of Systems    General:  No chills, fever, night sweats or weight changes.  Cardiovascular:  +++ Several year history of intermittent exertional chest discomfort associated with dyspnea on exertion.  No edema, orthopnea, palpitations, paroxysmal nocturnal dyspnea. Dermatological: No rash, lesions/masses Respiratory: +++ cough, +++ dyspnea Urologic: No hematuria, dysuria Abdominal:   +++ nausea, +++ vomiting, +++ diarrhea, no bright red blood per rectum, melena, or hematemesis Neurologic:  No visual changes, wkns, changes in mental status. All other systems reviewed and are otherwise negative except as noted above.  Physical Exam    Blood pressure (!) 137/97, pulse 99, temperature 98.3 F (36.8 C), temperature source Oral, resp. rate (!) 24, height 5' (1.524 m), weight 72.6 kg, SpO2 97 %.  General: Pleasant, NAD Psych: Normal affect. Neuro: Alert and oriented X 3. Moves all extremities spontaneously. HEENT: Normal  Neck: Supple without bruits or JVD. Lungs:  Resp regular and unlabored, coarse breath sounds with faint inspiratory and expiratory wheezing throughout. Heart: RRR no s3, s4, or  murmurs. Abdomen: Soft, non-tender, non-distended, BS + x 4.  Extremities: No clubbing, cyanosis or edema. DP/PT2+, Radials 2+ and equal bilaterally.  Labs    Cardiac Enzymes Recent Labs  Lab 07/06/2020 1235  TROPONINIHS 24,307*      Lab Results  Component Value Date   WBC 17.0 (H) 06/29/2020   HGB 16.7 (H) 06/24/2020   HCT 49.7 (H) 06/20/2020   MCV 90.2 06/28/2020   PLT 261 06/22/2020    Recent Labs  Lab 06/14/2020 1326  NA 133*  K 4.4  CL 95*  CO2 28  BUN 14  CREATININE 0.61  CALCIUM 9.2  GLUCOSE 121*    Radiology Studies    DG Chest 2 View  Result Date: 06/30/2020 CLINICAL DATA:    Shortness of breath. EXAM: CHEST - 2 VIEW COMPARISON:  08/17/2019 and prior exams FINDINGS: UPPER limits normal heart size noted. 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 Mild interstitial opacities are now identified. There is no evidence of consolidation, pleural effusion or pneumothorax. No acute bony abnormalities are present. IMPRESSION: Mild interstitial opacities-question interstitial edema or infection. COPD/emphysema changes. Electronically Signed   By: Harmon Pier M.D.   On: 06/26/2020 13:18    ECG & Cardiac Imaging    Sinus tachycardia, 108, anterior infarct - personally reviewed.  Assessment & Plan    1.  NSTEMI: Patient without prior history of CAD but with a several year history of intermittent exertional chest heaviness and dyspnea who presented to the emergency department this afternoon with a 24-hour history of severe substernal chest heaviness associated with nausea, vomiting, diaphoresis, and dyspnea.  Symptoms improved dramatically with sublingual nitroglycerin x1.  ECG shows sinus tachycardia with prior anterior infarct but no acute ST or T changes.  Chest pain currently rated 3/10.  Heparin has been started and she is already received aspirin.  Beta-blocker and high potency statin therapy ordered.  We Brianna Barker add IV nitroglycerin given ongoing, mild chest discomfort.  We Brianna Barker plan for  diagnostic catheterization tomorrow.  The patient understands that risks include but are not limited to stroke (1 in 1000), death (1 in 1000), kidney failure [usually temporary] (1 in 500), bleeding (1 in 200), allergic reaction [possibly serious] (1 in 200), and agrees to proceed.  Patient understands that if she has worsening symptoms, she is to alert staff as we would potentially need to activate cath team today.  Echo ordered.  2.  Lipids:  Status unknown.  Brianna Barker add high potency statin in setting of ACS and f/u lipids/lfts.  3.  Tobacco abuse/COPD: Smoking cessation advised.  She is wheezing some on exam.  Defer inhaler therapy to primary team.  4.  Leukocytosis: Likely reactive in the setting of #1.  She is afebrile and respiratory screen is negative.  Signed, Brianna Ducking, NP 07/01/2020, 4:15 PM  For questions or updates, please contact   Please consult [email or web address] for contact info under Cardiology/STEMI.   I have seen and examined this patient with Brianna Barker.  Agree with above, note added to reflect my findings.  On exam, RRR, no murmurs, lungs clear. Patient presented with around 24 hours of chest pain. Troponin significantly elevated, >24K. Patient with nonstemi. Received NTG with improvement in chest pain. On heparin ggt currently. Khole Branch start high dose statin and IV NTG. Omayra Tulloch plan for [facility] tomorrow.  Nolen Lindamood M. Lynn Recendiz MD 07/05/2020 6:41 AM

## 2020-06-19 NOTE — ED Provider Notes (Signed)
[facility] Emergency Department Provider Note   ____________________________________________   I have reviewed the triage vital signs and the nursing notes.   HISTORY  Chief Complaint Chest pain  History limited by: Not Limited   HPI Brianna Barker is a 70 y.o. female who presents to the emergency department today with concerns for chest pain shortness of breath and nausea.  Patient states that her symptoms started yesterday.  She states that her symptoms started with nausea and feeling unwell.  She then developed chest pressure.  It was located in the center part of her chest.  The discomfort continued today.  She also started having more shortness of breath.  Her chest pressure is not as bad today as it was yesterday.  Patient denies similar symptoms in the past.  Denies any recent fevers.  Records reviewed. Per medical record review patient has a history of bronchitis, diverticulitis.   Past Medical History:  Diagnosis Date  . Chronic bronchitis (HCC)   . Diverticulitis     There are no problems to display for this patient.   Past Surgical History:  Procedure Laterality Date  . ABDOMINAL HYSTERECTOMY    . TONSILLECTOMY      Prior to Admission medications   Medication Sig Start Date End Date Taking? Authorizing Provider  furosemide (LASIX) 20 MG tablet Take 1 tablet (20 mg total) by mouth daily for 10 days. 08/17/19 08/27/19  Dionne Bucy, MD    Allergies Aleve [naproxen]  History reviewed. No pertinent family history.  Social History Social History   Tobacco Use  . Smoking status: Current Every Day Smoker    Types: Cigarettes  . Smokeless tobacco: Never Used  Substance Use Topics  . Alcohol use: Not Currently  . Drug use: Never    Review of Systems Constitutional: No fever/chills Eyes: No visual changes. ENT: No sore throat. Cardiovascular: Positive for chest pain. Respiratory: Positive for shortness of  breath. Gastrointestinal: No abdominal pain. Positive for nausea.   Genitourinary: Negative for dysuria. Musculoskeletal: Negative for back pain. Skin: Negative for rash. Neurological: Negative for headaches, focal weakness or numbness.  ____________________________________________   PHYSICAL EXAM:  VITAL SIGNS: ED Triage Vitals  Enc Vitals Group     BP 06-22-2020 1235 (!) 158/103     Pulse Rate 2020-06-22 1235 (!) 102     Resp 06-22-2020 1235 (!) 26     Temp 06-22-20 1235 98.3 F (36.8 C)     Temp Source 06/22/2020 1235 Oral     SpO2 06-22-2020 1235 90 %     Weight 06-22-2020 1230 160 lb (72.6 kg)     Height 06-22-2020 1230 5' (1.524 m)     Head Circumference --      Peak Flow --      Pain Score 06/22/2020 1230 5   Constitutional: Alert and oriented.  Eyes: Conjunctivae are normal.  ENT      Head: Normocephalic and atraumatic.      Nose: No congestion/rhinnorhea.      Mouth/Throat: Mucous membranes are moist.      Neck: No stridor. Hematological/Lymphatic/Immunilogical: No cervical lymphadenopathy. Cardiovascular: Normal rate, regular rhythm.  No murmurs, rubs, or gallops.  Respiratory: Slightly increased respiratory effort. Mild expiratory wheezing at lung bases.  Gastrointestinal: Soft and non tender. No rebound. No guarding.  Genitourinary: Deferred Musculoskeletal: Normal range of motion in all extremities. No lower extremity edema. Neurologic:  Normal speech and language. No gross focal neurologic deficits are appreciated.  Skin:  Skin is  warm, dry and intact. No rash noted. Psychiatric: Mood and affect are normal. Speech and behavior are normal. Patient exhibits appropriate insight and judgment.  ____________________________________________    LABS (pertinent positives/negatives)  Trop hs 24,307 CBC wbc 17.0, hgb 16.7, plt 261 BMP na 133, k 4.4, glu 121, cr 0.61 ____________________________________________   EKG  I, Phineas Semen, attending physician, personally  viewed and interpreted this EKG  EKG Time: 1233 Rate: 108 Rhythm: sinus tachycardia Axis: normal Intervals: qtc 447 QRS: low voltage ST changes: No st elevation Impression: abnormal ekg   ____________________________________________    RADIOLOGY  CXR Mild interestitial opacities  ____________________________________________   PROCEDURES  Procedures  CRITICAL CARE Performed by: Phineas Semen   Total critical care time: 30 minutes  Critical care time was exclusive of separately billable procedures and treating other patients.  Critical care was necessary to treat or prevent imminent or life-threatening deterioration.  Critical care was time spent personally by me on the following activities: development of treatment plan with patient and/or surrogate as well as nursing, discussions with consultants, evaluation of patient's response to treatment, examination of patient, obtaining history from patient or surrogate, ordering and performing treatments and interventions, ordering and review of laboratory studies, ordering and review of radiographic studies, pulse oximetry and re-evaluation of patient's condition.  ____________________________________________   INITIAL IMPRESSION / ASSESSMENT AND PLAN / ED COURSE  Pertinent labs & imaging results that were available during my care of the patient were reviewed by me and considered in my medical decision making (see chart for details).   Patient presented to the emergency department today because of concerns for chest pain, shortness of breath and some nausea.  Patient's EKG without any ST elevation.  Did have a discussion with the patient.  I do have concerns that she likely had already taken yesterday.  Patient did have some chest pain on my initial exam although did feel improvement after nitroglycerin.  Will start heparin.  Did give patient aspirin.  Will plan admission to hospital  service.  ____________________________________________   FINAL CLINICAL IMPRESSION(S) / ED DIAGNOSES  Final diagnoses:  NSTEMI (non-ST elevated myocardial infarction) [facility])     Note: This dictation was prepared with Dragon dictation. Any transcriptional errors that result from this process are unintentional     Phineas Semen, MD 07/11/2020 1524

## 2020-06-19 NOTE — ED Triage Notes (Signed)
Patient c/o central chest heaviness and sob with nausea that started yesterday around 11am. Denies radiation. Labored breathing present.

## 2020-06-19 NOTE — ED Notes (Signed)
Phone report given to ICU RN 

## 2020-06-19 NOTE — ED Notes (Signed)
Pt desat to 86 with good wave form. Placed on 2L [place]

## 2020-06-19 NOTE — Progress Notes (Signed)
ANTICOAGULATION CONSULT NOTE  Pharmacy Consult for heparin infusion Indication: chest pain/ACS  Patient Measurements: Height: 5' (152.4 cm) Weight: 72.6 kg (160 lb) IBW/kg (Calculated) : 45.5 Heparin Dosing Weight: 61.6 kg  Vital Signs: Temp: 98.3 F (36.8 C) (03/06 1235) Temp Source: Oral (03/06 1235) BP: 158/103 (03/06 1235) Pulse Rate: 102 (03/06 1235)  Labs: Recent Labs    07/11/2020 1235  HGB 16.7*  HCT 49.7*  PLT 261  TROPONINIHS 24,307*    CrCl cannot be calculated (Patient's most recent lab result is older than the maximum 21 days allowed.).   Medical History: Past Medical History:  Diagnosis Date  . Chronic bronchitis (HCC)   . Diverticulitis     Assessment: 70 y.o. female who presents to the emergency department today with chest heaviness and sob with nausea that started yesterday around 11am. According to dispense records she is on no chronic anticoagulation prior to arrival.  Goal of Therapy:  Heparin level 0.3-0.7 units/ml Monitor platelets by anticoagulation protocol: Yes   Plan:  Give 4000 units bolus x 1 Start heparin infusion at 750 units/hr Check anti-Xa level in 8 hours and daily while on heparin Continue to monitor H&H and platelets  Lowella Bandy 06/17/2020,1:38 PM

## 2020-06-19 NOTE — Progress Notes (Signed)
ANTICOAGULATION CONSULT NOTE  Pharmacy Consult for heparin infusion Indication: chest pain/ACS  Patient Measurements: Height: 5' (152.4 cm) Weight: 72.7 kg (160 lb 4.4 oz) IBW/kg (Calculated) : 45.5 Heparin Dosing Weight: 61.6 kg  Vital Signs: Temp: 98.2 F (36.8 C) (03/06 2000) Temp Source: Oral (03/06 2000) BP: 105/80 (03/06 2300) Pulse Rate: 84 (03/06 2300)  Labs: Recent Labs    06/27/2020 1235 07/11/2020 1326 06/17/2020 1550 06/18/2020 1747 06/24/2020 2309  HGB 16.7*  --   --   --   --   HCT 49.7*  --   --   --   --   PLT 261  --   --   --   --   HEPARINUNFRC  --   --   --   --  0.26*  CREATININE  --  0.61  --   --   --   TROPONINIHS 24,307*  --  >27,000* >27,000*  --     Estimated Creatinine Clearance: 58.3 mL/min (by C-G formula based on SCr of 0.61 mg/dL).   Medical History: Past Medical History:  Diagnosis Date  . Chronic bronchitis (HCC)   . Diverticulitis   . Morbid obesity (HCC)   . Tobacco abuse     Assessment: 69 y.o. female who presents to the emergency department today with chest heaviness and sob with nausea that started yesterday around 11am. According to dispense records she is on no chronic anticoagulation prior to arrival.  Goal of Therapy:  Heparin level 0.3-0.7 units/ml Monitor platelets by anticoagulation protocol: Yes   Plan:  3/6:  HL @ 2309 = 0.26 Will order Heparin 900 units IV X 1 bolus and increase drip rate to 850 units/hr.  Will recheck HL 6 hrs after rate change.   Makenly Larabee D 07/09/2020,11:35 PM

## 2020-06-20 ENCOUNTER — Encounter: Admission: EM | Disposition: E | Payer: Self-pay | Source: Home / Self Care | Attending: Student

## 2020-06-20 ENCOUNTER — Other Ambulatory Visit: Payer: Self-pay

## 2020-06-20 ENCOUNTER — Inpatient Hospital Stay: Payer: Medicare HMO

## 2020-06-20 DIAGNOSIS — I2510 Atherosclerotic heart disease of native coronary artery without angina pectoris: Secondary | ICD-10-CM | POA: Diagnosis not present

## 2020-06-20 DIAGNOSIS — I959 Hypotension, unspecified: Secondary | ICD-10-CM | POA: Diagnosis not present

## 2020-06-20 DIAGNOSIS — J9601 Acute respiratory failure with hypoxia: Secondary | ICD-10-CM

## 2020-06-20 DIAGNOSIS — E785 Hyperlipidemia, unspecified: Secondary | ICD-10-CM | POA: Diagnosis not present

## 2020-06-20 DIAGNOSIS — J449 Chronic obstructive pulmonary disease, unspecified: Secondary | ICD-10-CM

## 2020-06-20 DIAGNOSIS — I214 Non-ST elevation (NSTEMI) myocardial infarction: Secondary | ICD-10-CM | POA: Diagnosis not present

## 2020-06-20 DIAGNOSIS — D72825 Bandemia: Secondary | ICD-10-CM

## 2020-06-20 DIAGNOSIS — I509 Heart failure, unspecified: Secondary | ICD-10-CM

## 2020-06-20 DIAGNOSIS — Z720 Tobacco use: Secondary | ICD-10-CM | POA: Diagnosis not present

## 2020-06-20 DIAGNOSIS — F17200 Nicotine dependence, unspecified, uncomplicated: Secondary | ICD-10-CM | POA: Diagnosis not present

## 2020-06-20 HISTORY — PX: LEFT HEART CATH AND CORONARY ANGIOGRAPHY: CATH118249

## 2020-06-20 LAB — LIPID PANEL
Cholesterol: 171 mg/dL (ref 0–200)
HDL: 47 mg/dL (ref >40)
LDL Cholesterol: 95 mg/dL (ref 0–99)
Total CHOL/HDL Ratio: 3.6 RATIO
Triglycerides: 143 mg/dL (ref <150)
VLDL: 29 mg/dL (ref 0–40)

## 2020-06-20 LAB — BLOOD GAS, ARTERIAL
Acid-Base Excess: 7.8 mmol/L — ABNORMAL HIGH (ref 0.0–2.0)
Bicarbonate: 38.1 mmol/L — ABNORMAL HIGH (ref 20.0–28.0)
FIO2: 100
O2 Saturation: 99.8 %
Patient temperature: 37
pCO2 arterial: 83 mmHg (ref 32.0–48.0)
pH, Arterial: 7.27 — ABNORMAL LOW (ref 7.350–7.450)
pO2, Arterial: 265 mmHg — ABNORMAL HIGH (ref 83.0–108.0)

## 2020-06-20 LAB — HIV ANTIBODY (ROUTINE TESTING W REFLEX): HIV Screen 4th Generation wRfx: NONREACTIVE

## 2020-06-20 LAB — CBC
HCT: 46.3 % — ABNORMAL HIGH (ref 36.0–46.0)
Hemoglobin: 15.1 g/dL — ABNORMAL HIGH (ref 12.0–15.0)
MCH: 30.7 pg (ref 26.0–34.0)
MCHC: 32.6 g/dL (ref 30.0–36.0)
MCV: 94.1 fL (ref 80.0–100.0)
Platelets: 141 10*3/uL — ABNORMAL LOW (ref 150–400)
RBC: 4.92 MIL/uL (ref 3.87–5.11)
RDW: 12.5 % (ref 11.5–15.5)
WBC: 14.4 10*3/uL — ABNORMAL HIGH (ref 4.0–10.5)
nRBC: 0 % (ref 0.0–0.2)

## 2020-06-20 LAB — HEPARIN LEVEL (UNFRACTIONATED): Heparin Unfractionated: 0.29 IU/mL — ABNORMAL LOW (ref 0.30–0.70)

## 2020-06-20 LAB — GLUCOSE, CAPILLARY
Glucose-Capillary: 123 mg/dL — ABNORMAL HIGH (ref 70–99)
Glucose-Capillary: 112 mg/dL — ABNORMAL HIGH (ref 70–99)
Glucose-Capillary: 102 mg/dL — ABNORMAL HIGH (ref 70–99)
Glucose-Capillary: 92 mg/dL (ref 70–99)

## 2020-06-20 LAB — BASIC METABOLIC PANEL
Anion gap: 10 (ref 5–15)
BUN: 18 mg/dL (ref 8–23)
CO2: 29 mmol/L (ref 22–32)
Calcium: 8.6 mg/dL — ABNORMAL LOW (ref 8.9–10.3)
Chloride: 94 mmol/L — ABNORMAL LOW (ref 98–111)
Creatinine, Ser: 0.62 mg/dL (ref 0.44–1.00)
GFR, Estimated: >60 mL/min (ref >60)
Glucose, Bld: 129 mg/dL — ABNORMAL HIGH (ref 70–99)
Potassium: 4.6 mmol/L (ref 3.5–5.1)
Sodium: 133 mmol/L — ABNORMAL LOW (ref 135–145)

## 2020-06-20 LAB — PROTIME-INR
INR: 1 (ref 0.8–1.2)
Prothrombin Time: 13.1 seconds (ref 11.4–15.2)

## 2020-06-20 LAB — TSH: TSH: 0.931 u[IU]/mL (ref 0.350–4.500)

## 2020-06-20 LAB — TROPONIN I (HIGH SENSITIVITY)
Troponin I (High Sensitivity): 25969 ng/L (ref <18)
Troponin I (High Sensitivity): 20881 ng/L (ref <18)

## 2020-06-20 LAB — HEMOGLOBIN A1C
Hgb A1c MFr Bld: 6.1 % — ABNORMAL HIGH (ref 4.8–5.6)
Mean Plasma Glucose: 128.37 mg/dL

## 2020-06-20 LAB — OSMOLALITY: Osmolality: 280 mOsm/kg (ref 275–295)

## 2020-06-20 LAB — SODIUM, URINE, RANDOM: Sodium, Ur: 27 mmol/L

## 2020-06-20 SURGERY — LEFT HEART CATH AND CORONARY ANGIOGRAPHY
Anesthesia: Moderate Sedation

## 2020-06-20 MED ORDER — VERAPAMIL HCL 2.5 MG/ML IV SOLN
INTRAVENOUS | Status: AC
  Filled 2020-06-20: qty 2

## 2020-06-20 MED ORDER — LIDOCAINE HCL (PF) 1 % IJ SOLN
INTRAMUSCULAR | Status: AC
  Filled 2020-06-20: qty 30

## 2020-06-20 MED ORDER — CHLORHEXIDINE GLUCONATE CLOTH 2 % EX PADS: 6.0000 | MEDICATED_PAD | Freq: Every day | CUTANEOUS | Status: DC

## 2020-06-20 MED ORDER — FENTANYL CITRATE (PF) 100 MCG/2ML IJ SOLN
INTRAMUSCULAR | Status: DC | PRN
  Administered 2020-06-20: 12.5 ug via INTRAVENOUS

## 2020-06-20 MED ORDER — MORPHINE SULFATE (PF) 2 MG/ML IV SOLN
INTRAVENOUS | Status: AC
  Filled 2020-06-20: qty 1

## 2020-06-20 MED ORDER — HEPARIN SODIUM (PORCINE) 1000 UNIT/ML IJ SOLN
INTRAMUSCULAR | Status: AC
  Filled 2020-06-20: qty 1

## 2020-06-20 MED ORDER — SODIUM CHLORIDE 0.9% FLUSH: 3.0000 mL | INTRAVENOUS | Status: DC | PRN

## 2020-06-20 MED ORDER — FUROSEMIDE 10 MG/ML IJ SOLN
INTRAMUSCULAR | Status: AC
  Filled 2020-06-20: qty 4

## 2020-06-20 MED ORDER — VERAPAMIL HCL 2.5 MG/ML IV SOLN
INTRAVENOUS | Status: DC | PRN
  Administered 2020-06-20: 2.5 mg via INTRAVENOUS

## 2020-06-20 MED ORDER — ENOXAPARIN SODIUM 40 MG/0.4ML ~~LOC~~ SOLN
40.0000 mg | SUBCUTANEOUS | Status: DC
  Administered 2020-06-21: 40 mg via SUBCUTANEOUS
  Filled 2020-06-20 (×2): qty 0.4

## 2020-06-20 MED ORDER — SODIUM CHLORIDE 0.9% FLUSH
3.0000 mL | Freq: Two times a day (BID) | INTRAVENOUS | Status: DC
  Administered 2020-06-20 – 2020-06-21 (×3): 3 mL via INTRAVENOUS

## 2020-06-20 MED ORDER — MIDAZOLAM HCL 2 MG/2ML IJ SOLN
INTRAMUSCULAR | Status: DC | PRN
  Administered 2020-06-20: 0.5 mg via INTRAVENOUS

## 2020-06-20 MED ORDER — CLOPIDOGREL BISULFATE 75 MG PO TABS
300.0000 mg | ORAL_TABLET | Freq: Once | ORAL | Status: AC
  Administered 2020-06-20: 300 mg via ORAL
  Filled 2020-06-20: qty 4

## 2020-06-20 MED ORDER — HEPARIN (PORCINE) IN NACL 1000-0.9 UT/500ML-% IV SOLN
INTRAVENOUS | Status: AC
  Filled 2020-06-20: qty 1000

## 2020-06-20 MED ORDER — LIDOCAINE HCL (PF) 1 % IJ SOLN
INTRAMUSCULAR | Status: DC | PRN
  Administered 2020-06-20: 2 mL

## 2020-06-20 MED ORDER — FENTANYL CITRATE (PF) 100 MCG/2ML IJ SOLN
INTRAMUSCULAR | Status: AC
  Filled 2020-06-20: qty 2

## 2020-06-20 MED ORDER — HEPARIN (PORCINE) IN NACL 1000-0.9 UT/500ML-% IV SOLN
INTRAVENOUS | Status: DC | PRN
Start: 2020-06-20 — End: 2020-06-20
  Administered 2020-06-20 (×2): 500 mL

## 2020-06-20 MED ORDER — FUROSEMIDE 10 MG/ML IJ SOLN
40.0000 mg | Freq: Once | INTRAMUSCULAR | Status: AC
  Administered 2020-06-20: 40 mg via INTRAVENOUS

## 2020-06-20 MED ORDER — MIDAZOLAM HCL 2 MG/2ML IJ SOLN
INTRAMUSCULAR | Status: AC
  Filled 2020-06-20: qty 2

## 2020-06-20 MED ORDER — HEPARIN BOLUS VIA INFUSION
900.0000 [IU] | Freq: Once | INTRAVENOUS | Status: AC
  Administered 2020-06-20: 900 [IU] via INTRAVENOUS
  Filled 2020-06-20: qty 900

## 2020-06-20 MED ORDER — CLOPIDOGREL BISULFATE 75 MG PO TABS
75.0000 mg | ORAL_TABLET | Freq: Every day | ORAL | Status: DC
  Administered 2020-06-21: 75 mg via ORAL
  Filled 2020-06-20: qty 1

## 2020-06-20 MED ORDER — IOHEXOL 300 MG/ML  SOLN
INTRAMUSCULAR | Status: DC | PRN
  Administered 2020-06-20: 44 mL

## 2020-06-20 MED ORDER — HEPARIN SODIUM (PORCINE) 1000 UNIT/ML IJ SOLN
INTRAMUSCULAR | Status: DC | PRN
  Administered 2020-06-20: 3500 [IU] via INTRAVENOUS

## 2020-06-20 MED ORDER — ONDANSETRON HCL 4 MG/2ML IJ SOLN
INTRAMUSCULAR | Status: AC
  Filled 2020-06-20: qty 2

## 2020-06-20 MED ORDER — SODIUM CHLORIDE 0.9 % IV SOLN: 250.0000 mL | INTRAVENOUS | Status: DC | PRN

## 2020-06-20 SURGICAL SUPPLY — 9 items
CATH INFINITI 5FR JK (CATHETERS) ×1 IMPLANT
CATH INFINITI JR4 5F (CATHETERS) ×1 IMPLANT
DEVICE RAD TR BAND REGULAR (VASCULAR PRODUCTS) ×1 IMPLANT
GLIDESHEATH SLEND SS 6F .021 (SHEATH) ×1 IMPLANT
GUIDEWIRE INQWIRE 1.5J.035X260 (WIRE) IMPLANT
INQWIRE 1.5J .035X260CM (WIRE) ×2
PACK CARDIAC CATH (CUSTOM PROCEDURE TRAY) ×2 IMPLANT
SET ATX SIMPLICITY (MISCELLANEOUS) ×1 IMPLANT
WIRE HITORQ VERSACORE ST 145CM (WIRE) ×1 IMPLANT

## 2020-06-20 NOTE — Progress Notes (Signed)
I was called with increased shortness of breath and hypoxia.  The patient denies chest pain but does complain of dizziness and nausea.  By exam, she is mildly tachycardic.  Lung examination reveals bibasilar crackles.  The patient seems to be volume overloaded.  We will give furosemide 40 mg x 1 and reevaluate. She is at high risk for ischemic mitral regurgitation. I updated the patient's daughter-in-law on the phone.

## 2020-06-20 NOTE — Progress Notes (Signed)
Patients PO2 SATs dropped to 79 on 5 L [place] HR up to 120's. Placed patient on 100% NRB. PO2 SATs up to 100%. Patient is nauseous and stats she doesn't feel good. Medicated patient with Zofran and MSO4. Message to [facility] NP, awaiting message back. Patient placed back on 5 L [place].

## 2020-06-20 NOTE — Interval H&P Note (Signed)
Cath Lab Visit (complete for each Cath Lab visit)  Clinical Evaluation Leading to the Procedure:   ACS: Yes.    Non-ACS:  n/a    History and Physical Interval Note:  2020/07/16 12:46 PM  Brianna Barker  has presented today for surgery, with the diagnosis of nstemi.  The various methods of treatment have been discussed with the patient and family. After consideration of risks, benefits and other options for treatment, the patient has consented to  Procedure(s): LEFT HEART CATH AND CORONARY ANGIOGRAPHY (N/A) as a surgical intervention.  The patient's history has been reviewed, patient examined, no change in status, stable for surgery.  I have reviewed the patient's chart and labs.  Questions were answered to the patient's satisfaction.     Lorine Bears

## 2020-06-20 NOTE — Progress Notes (Signed)
Progress Note  Patient Name: Brianna Barker Date of Encounter: 06/26/2020  [facility] Cardiologist: No primary care provider on file. new Kirke Corin)  Subjective   Her chest pain started Saturday morning and continued until last night.  She is currently chest pain-free.  She underwent cardiac catheterization via the right radial artery which showed an occluded left circumflex which is the likely culprit for late presenting posterior myocardial infarction that was silent on EKG.  The patient was nauseous this morning on nitroglycerin drip and blood pressure was low and thus nitroglycerin was discontinued.  Inpatient Medications    Scheduled Meds: . [MAR Hold] aspirin EC  81 mg Oral Daily  . [MAR Hold] atorvastatin  80 mg Oral Daily  . [MAR Hold] carvedilol  3.125 mg Oral BID WC  . [MAR Hold] Chlorhexidine Gluconate Cloth  6 each Topical Daily  . [MAR Hold] LORazepam  1 mg Intravenous Once  . [MAR Hold] mouth rinse  15 mL Mouth Rinse BID  . [MAR Hold] nicotine  21 mg Transdermal Daily  . [MAR Hold] sodium chloride flush  3 mL Intravenous Q12H   Continuous Infusions: . sodium chloride    . sodium chloride 75 mL/hr at 07/12/2020 0800  . heparin Stopped (06/28/2020 1205)  . [MAR Hold] nitroGLYCERIN Stopped (06/30/2020 0930)   PRN Meds: sodium chloride, [MAR Hold] acetaminophen, [MAR Hold] ipratropium-albuterol, [MAR Hold]  morphine injection, [MAR Hold] nitroGLYCERIN, [MAR Hold] ondansetron (ZOFRAN) IV, sodium chloride flush   Vital Signs    Vitals:   06/27/2020 1100 06/14/2020 1207 07/12/2020 1249 06/29/2020 1345  BP: 93/63 (!) 112/91  102/72  Pulse: (!) 101 (!) 106  (!) 102  Resp: 20 19  17   Temp:  98.9 F (37.2 C)    TempSrc:  Oral    SpO2: 100% 99% 99% 100%  Weight:  72.7 kg    Height:  5' (1.524 m)      Intake/Output Summary (Last 24 hours) at 06/27/2020 1351 Last data filed at 06/22/2020 0800 Gross per 24 hour  Intake 870.09 ml  Output 1100 ml  Net -229.91 ml   Last 3 Weights  07/02/2020 07/08/2020 06/14/2020  Weight (lbs) 160 lb 4.4 oz 160 lb 4.4 oz 160 lb  Weight (kg) 72.7 kg 72.7 kg 72.576 kg      Telemetry    Sinus rhythm with sinus tachycardia- Personally Reviewed  ECG    Not performed today- Personally Reviewed  Physical Exam   GEN: No acute distress.   Neck: No JVD Cardiac: RRR, no murmurs, rubs, or gallops.  Respiratory: Clear to auscultation bilaterally. GI: Soft, nontender, non-distended  MS: No edema; No deformity. Neuro:  Nonfocal  Psych: Normal affect   Labs    High Sensitivity Troponin:   Recent Labs  Lab 06/26/2020 1235 06/17/2020 1550 06/25/2020 1747 06/27/2020 1053  TROPONINIHS 24,307* >27,000* >27,000* 25,969*      Chemistry Recent Labs  Lab 07/03/2020 1326 07/12/2020 0611  NA 133* 133*  K 4.4 4.6  CL 95* 94*  CO2 28 29  GLUCOSE 121* 129*  BUN 14 18  CREATININE 0.61 0.62  CALCIUM 9.2 8.6*  GFRNONAA >60 >60  ANIONGAP 10 10     Hematology Recent Labs  Lab 07/11/2020 1235 06/24/2020 0611  WBC 17.0* 14.4*  RBC 5.51* 4.92  HGB 16.7* 15.1*  HCT 49.7* 46.3*  MCV 90.2 94.1  MCH 30.3 30.7  MCHC 33.6 32.6  RDW 12.5 12.5  PLT 261 141*    BNP Recent  Labs  Lab 26-Jun-2020 1550  BNP 322.4*     DDimer  Recent Labs  Lab 26-Jun-2020 1550  DDIMER 0.41     Radiology    DG Chest 2 View  Result Date: 06-26-2020 CLINICAL DATA:  Shortness of breath. EXAM: CHEST - 2 VIEW COMPARISON:  08/17/2019 and prior exams FINDINGS: UPPER limits normal heart size noted. 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 Mild interstitial opacities are now identified. There is no evidence of consolidation, pleural effusion or pneumothorax. No acute bony abnormalities are present. IMPRESSION: Mild interstitial opacities-question interstitial edema or infection. COPD/emphysema changes. Electronically Signed   By: Harmon Pier M.D.   On: Jun 26, 2020 13:18   CARDIAC CATHETERIZATION  Result Date: 06/22/2020  Prox LAD to Mid LAD lesion is 40% stenosed.  Prox Cx to Mid Cx  lesion is 100% stenosed.  The left ventricular ejection fraction is 35-45% by visual estimate.  There is moderate left ventricular systolic dysfunction.  LV end diastolic pressure is moderately elevated.  Ost RCA to Prox RCA lesion is 60% stenosed.  Mid RCA lesion is 60% stenosed.  1.  Significant two-vessel coronary artery disease with occluded mid left circumflex after the origin of a small OM1 branch.  There seems to be a large organized thrombus in the left circumflex and this occlusion seems to be more than 48 hours in duration.  In addition, there is borderline significant disease in ostial right coronary artery extending to the mid segment. 2.  Moderately reduced LV systolic function with an EF of 35 to 40% with significant inferior apical hypokinesis. 3.  Moderately elevated left ventricular end-diastolic pressure at 23 mmHg. Recommendations: Suspect that the left circumflex has been occluded for more than 48 hours correlating with onset of symptoms on Saturday morning.  Given that currently she is chest pain-free, there is no benefit from revascularizing the left circumflex at the present time. Recommend medical therapy for now.  I added clopidogrel. Recommend fractional flow reserve evaluation and possible PCI of the right coronary artery in 2 to 4 weeks once she recovers from her current myocardial infarction.    Cardiac Studies   Echocardiogram is pending  Patient Profile     70 y.o. female with history of tobacco use, chronic bronchitis and diverticulitis who presented yesterday with continuous chest pain which started on Saturday morning.  She was found to have a troponin greater than 24,000 on presentation.  Assessment & Plan    1.  Late presenting posterior ST elevation myocardial infarction: The patient presented to the hospital more than 24 hours from the onset of her symptoms and her troponin was noted to be significantly elevated.  Cardiac catheterization today confirmed that  her left circumflex was occluded with a large organized thrombus.  Given onset of more than 48 hours and being chest pain-free, there is no benefit from revascularization of the left circumflex. I discontinued heparin and added clopidogrel. She does have likely significant disease affecting the ostial, proximal and mid right coronary artery.  Recommend staged fractional flow reserve evaluation of the right coronary artery with possible PCI to be done in 2 to 4 weeks to allow recovery from current myocardial infarction. Continue low-dose aspirin and high-dose atorvastatin.  2.  Hyperlipidemia: Lipid profile showed an LDL of 95.  Given presentation of myocardial infarction, she was started on high-dose statin.  3.  Tobacco use: Smoking cessation advised.  Continue to monitor the patient in the intensive care unit today and likely transfer to a tomorrow.  The  patient is at high risk for mechanical complications related to late presenting myocardial infarction.      For questions or updates, please contact [facility] Please consult [email or web address] for contact info under        Signed, Lorine Bears, MD  06/17/2020, 1:51 PM

## 2020-06-20 NOTE — Progress Notes (Signed)
ANTICOAGULATION CONSULT NOTE  Pharmacy Consult for heparin infusion Indication: chest pain/ACS  Patient Measurements: Height: 5' (152.4 cm) Weight: 72.7 kg (160 lb 4.4 oz) IBW/kg (Calculated) : 45.5 Heparin Dosing Weight: 61.6 kg  Vital Signs: Temp: 98.3 F (36.8 C) (03/07 0100) BP: 83/56 (03/07 0809) Pulse Rate: 92 (03/07 0700)  Labs: Recent Labs    07/05/2020 1235 2020-07-05 1326 2020/07/05 1550 07-05-20 1747 Jul 05, 2020 2309 06/29/2020 0611  HGB 16.7*  --   --   --   --  15.1*  HCT 49.7*  --   --   --   --  46.3*  PLT 261  --   --   --   --  141*  LABPROT  --   --   --   --   --  13.1  INR  --   --   --   --   --  1.0  HEPARINUNFRC  --   --   --   --  0.26* 0.29*  CREATININE  --  0.61  --   --   --  0.62  TROPONINIHS 24,307*  --  >27,000* >27,000*  --   --     Estimated Creatinine Clearance: 58.3 mL/min (by C-G formula based on SCr of 0.62 mg/dL).   Medical History: Past Medical History:  Diagnosis Date  . Chronic bronchitis (HCC)   . Diverticulitis   . Morbid obesity (HCC)   . Tobacco abuse     Assessment: 70 y.o. female who presents to the emergency department today with chest heaviness and sob with nausea that started yesterday around 11am. According to dispense records she is on no chronic anticoagulation prior to arrival.  Goal of Therapy:  Heparin level 0.3-0.7 units/ml Monitor platelets by anticoagulation protocol: Yes   3/6 2309 HL 0.26 3/7 0611 HL 0.29  Plan:  Heparin level subtherapeutic. Heparin 900 unit bolus followed by increase in rate to 950 units/hr. Recheck HL at 1600. CBC daily while on heparin drip.  Pricilla Riffle, PharmD 06/23/2020,8:45 AM

## 2020-06-20 NOTE — Progress Notes (Signed)
PROGRESS NOTE  Brianna Barker ZOX:[id number] DOB: [birth year]   PCP: Patient, No Pcp Per  Patient is from: Home.  Lives with her son.  Ambulates independently at baseline.  DOA: 06/27/2020 LOS: 1  Chief complaints: Chest pain  Brief Narrative / Interim history: 70 year old F with PMH of COPD and tobacco use disorder presented to ED with 24 hours of substernal chest pain associated with nausea, shortness of breath and diaphoresis.  She had markedly elevated troponin to 24,000 and admitted for non-STEMI. EKG with nonspecific ST and T wave changes but not acute. Cardiology consulted and planning LHC.  Subjective: Seen and examined earlier this morning.  Patient had some respiratory distress requiring NRB overnight.  Breathing improved after IV morphine.  She was weaned to 5 L by nasal cannula.  She is resting in bed.  Reports nausea but no chest pain and shortness of breath.  She states she had one episode of emesis last night.  Taking the emesis looked dark but no bright red blood.  Objective: Vitals:   07/10/2020 0500 07/08/2020 0621 07/10/2020 0700 06/28/2020 0809  BP: 95/66 (!) 86/60 (!) 85/54 (!) 83/56  Pulse: 92 94 92   Resp: 16 18 17    Temp:      TempSrc:      SpO2: 100% 100% 100%   Weight:      Height:        Intake/Output Summary (Last 24 hours) at 06/28/2020 1014 Last data filed at 06/20/2020 0647 Gross per 24 hour  Intake 764.84 ml  Output 1100 ml  Net -335.16 ml   Filed Weights   07/02/2020 1230 06/19/20 1700  Weight: 72.6 kg 72.7 kg    Examination:  GENERAL: No apparent distress.  Nontoxic. HEENT: MMM.  Vision and hearing grossly intact.  NECK: Supple.  No apparent JVD.  RESP: 100% on 5 L.  No IWOB.  Fair aeration bilaterally. CVS:  RRR. Heart sounds normal.  ABD/GI/GU: BS+. Abd soft, NTND.  MSK/EXT:  Moves extremities. No apparent deformity. No edema.  SKIN: no apparent skin lesion or wound NEURO: Awake, alert and oriented appropriately.  No apparent focal neuro  deficit. PSYCH: Calm. Normal affect.   Procedures:  None yet.  Microbiology summarized: COVID-19 and influenza PCR nonreactive  Assessment & Plan: Typical chest pain/non-STEMI-markedly elevated troponin (24,000 to greater than 27,000).  LDL 95.  A1c 6.1%.  D-dimer negative. -Plan for LHC and TTE today -Continue heparin dripaspirin, statin, beta-blocker, as needed morphine and nitro as blood pressure allows.  Acute CHF-unknown type.  BNP in 300.  CXR consistent with mild CHF.  However, she does not appear fluid overloaded on exam.  Had about a liter of urine output overnight after a dose of IV Lasix.  Soft blood pressures this morning.  Renal function stable. -Hold IV Lasix -Monitor fluid status, renal functions and electrolytes -Follow LHC and TTE  Acute respiratory failure with hypoxia: Likely due to acute CHF and non-STEMI.  She also have underlying COPD which might be contributing. -Management as above -Wean oxygen as able  Leukocytosis: Likely demargination from stress response.  Improved. -Continue monitoring  Mild hyponatremia: Na 133. ?due to fluid overload.  Urine sodium elevated to 27 but obtained after Lasix. -Monitor  Chronic bronchitis /chronic COPD-no formal PFT in nature.  Does not seem to be on medication. -As needed nebulizers -Encouraged tobacco cessation -She would benefit from outpatient PFT  Tobacco abuse: Smokes about a pack a day.  About 60-pack-year history -encouraged cessation  -Nicotine  patch  Class I obesity Body mass index is 31.3 kg/m.         DVT prophylaxis:  On heparin drip for non-STEMI  Code Status: Full code Family Communication: Updated patient's son over the phone. Level of care: Stepdown Status is: Inpatient  Remains inpatient appropriate because:Hemodynamically unstable, Ongoing diagnostic testing needed not appropriate for outpatient work up, IV treatments appropriate due to intensity of illness or inability to take PO  and Inpatient level of care appropriate due to severity of illness   Dispo: The patient is from: Home              Anticipated d/c is to: Home              Patient currently is not medically stable to d/c.   Difficult to place patient No       Consultants:  Cardiology   Sch Meds:  Scheduled Meds: . aspirin EC  81 mg Oral Daily  . atorvastatin  80 mg Oral Daily  . carvedilol  3.125 mg Oral BID WC  . Chlorhexidine Gluconate Cloth  6 each Topical Daily  . LORazepam  1 mg Intravenous Once  . mouth rinse  15 mL Mouth Rinse BID  . nicotine  21 mg Transdermal Daily  . sodium chloride flush  3 mL Intravenous Q12H   Continuous Infusions: . sodium chloride    . sodium chloride 75 mL/hr at 2020-07-17 0647  . heparin 950 Units/hr (Jul 17, 2020 0929)  . nitroGLYCERIN Stopped (07/17/20 0930)   PRN Meds:.sodium chloride, acetaminophen, ipratropium-albuterol, morphine injection, nitroGLYCERIN, ondansetron (ZOFRAN) IV, sodium chloride flush  Antimicrobials: Anti-infectives (From admission, onward)   None       I have personally reviewed the following labs and images: CBC: Recent Labs  Lab 07/09/2020 1235 2020/07/17 0611  WBC 17.0* 14.4*  HGB 16.7* 15.1*  HCT 49.7* 46.3*  MCV 90.2 94.1  PLT 261 141*   BMP &GFR Recent Labs  Lab 06/29/2020 1326 07/17/20 0611  NA 133* 133*  K 4.4 4.6  CL 95* 94*  CO2 28 29  GLUCOSE 121* 129*  BUN 14 18  CREATININE 0.61 0.62  CALCIUM 9.2 8.6*   Estimated Creatinine Clearance: 58.3 mL/min (by C-G formula based on SCr of 0.62 mg/dL). Liver & Pancreas: No results for input(s): AST, ALT, ALKPHOS, BILITOT, PROT, ALBUMIN in the last 168 hours. No results for input(s): LIPASE, AMYLASE in the last 168 hours. No results for input(s): AMMONIA in the last 168 hours. Diabetic: Recent Labs    07/02/2020 2309  HGBA1C 6.1*   Recent Labs  Lab 06/24/2020 1655 06/14/2020 2109 07-17-2020 0732  GLUCAP 109* 133* 123*   Cardiac Enzymes: No results for  input(s): CKTOTAL, CKMB, CKMBINDEX, TROPONINI in the last 168 hours. No results for input(s): PROBNP in the last 8760 hours. Coagulation Profile: Recent Labs  Lab 17-Jul-2020 0611  INR 1.0   Thyroid Function Tests: Recent Labs    07/03/2020 2309  TSH 0.931   Lipid Profile: Recent Labs    17-Jul-2020 0611  CHOL 171  HDL 47  LDLCALC 95  TRIG 143  CHOLHDL 3.6   Anemia Panel: No results for input(s): VITAMINB12, FOLATE, FERRITIN, TIBC, IRON, RETICCTPCT in the last 72 hours. Urine analysis: No results found for: COLORURINE, APPEARANCEUR, LABSPEC, PHURINE, GLUCOSEU, HGBUR, BILIRUBINUR, KETONESUR, PROTEINUR, UROBILINOGEN, NITRITE, LEUKOCYTESUR Sepsis Labs: Invalid input(s): PROCALCITONIN, LACTICIDVEN  Microbiology: Recent Results (from the past 240 hour(s))  Resp Panel by RT-PCR (Flu A&B, Covid) Nasopharyngeal Swab  Status: None   Collection Time: 06/30/2020  1:55 PM   Specimen: Nasopharyngeal Swab; Nasopharyngeal(NP) swabs in vial transport medium  Result Value Ref Range Status   SARS Coronavirus 2 by RT PCR NEGATIVE NEGATIVE Final    Comment: (NOTE) SARS-CoV-2 target nucleic acids are NOT DETECTED.  The SARS-CoV-2 RNA is generally detectable in upper respiratory specimens during the acute phase of infection. The lowest concentration of SARS-CoV-2 viral copies this assay can detect is 138 copies/mL. A negative result does not preclude SARS-Cov-2 infection and should not be used as the sole basis for treatment or other patient management decisions. A negative result may occur with  improper specimen collection/handling, submission of specimen other than nasopharyngeal swab, presence of viral mutation(s) within the areas targeted by this assay, and inadequate number of viral copies(<138 copies/mL). A negative result must be combined with clinical observations, patient history, and epidemiological information. The expected result is Negative.  Fact Sheet for Patients:   BloggerCourse.com  Fact Sheet for Healthcare Providers:  SeriousBroker.it  This test is no t yet approved or cleared by the [place] FDA and  has been authorized for detection and/or diagnosis of SARS-CoV-2 by FDA under an Emergency Use Authorization (EUA). This EUA will remain  in effect (meaning this test can be used) for the duration of the COVID-19 declaration under Section 564(b)(1) of the Act, 21 U.S.C.section 360bbb-3(b)(1), unless the authorization is terminated  or revoked sooner.       Influenza A by PCR NEGATIVE NEGATIVE Final   Influenza B by PCR NEGATIVE NEGATIVE Final    Comment: (NOTE) The Xpert Xpress SARS-CoV-2/FLU/RSV plus assay is intended as an aid in the diagnosis of influenza from Nasopharyngeal swab specimens and should not be used as a sole basis for treatment. Nasal washings and aspirates are unacceptable for Xpert Xpress SARS-CoV-2/FLU/RSV testing.  Fact Sheet for Patients: BloggerCourse.com  Fact Sheet for Healthcare Providers: SeriousBroker.it  This test is not yet approved or cleared by the [place] FDA and has been authorized for detection and/or diagnosis of SARS-CoV-2 by FDA under an Emergency Use Authorization (EUA). This EUA will remain in effect (meaning this test can be used) for the duration of the COVID-19 declaration under Section 564(b)(1) of the Act, 21 U.S.C. section 360bbb-3(b)(1), unless the authorization is terminated or revoked.  Performed at [facility], [address] [place], [place] [address]   MRSA PCR Screening     Status: None   Collection Time: 06/18/2020  5:16 PM   Specimen: Nasal Mucosa; Nasopharyngeal  Result Value Ref Range Status   MRSA by PCR NEGATIVE NEGATIVE Final    Comment:        The GeneXpert MRSA Assay (FDA approved for NASAL specimens only), is one component of a comprehensive MRSA  colonization surveillance program. It is not intended to diagnose MRSA infection nor to guide or monitor treatment for MRSA infections. Performed at [facility], [address] [place], [place] [address]     Radiology Studies: DG Chest 2 View  Result Date: 06/29/2020 CLINICAL DATA:  Shortness of breath. EXAM: CHEST - 2 VIEW COMPARISON:  08/17/2019 and prior exams FINDINGS: UPPER limits normal heart size noted. 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 Mild interstitial opacities are now identified. There is no evidence of consolidation, pleural effusion or pneumothorax. No acute bony abnormalities are present. IMPRESSION: Mild interstitial opacities-question interstitial edema or infection. COPD/emphysema changes. Electronically Signed   By: Harmon Pier M.D.   On: 07/12/2020 13:18  Esiah Bazinet T. Haile Toppins [facility]  If 7PM-7AM, please contact night-coverage [email or web address] 06/29/2020, 10:14 AM

## 2020-06-20 NOTE — Progress Notes (Signed)
Initial Nutrition Assessment  DOCUMENTATION CODES:   Obesity unspecified  INTERVENTION:   RD will add supplements once diet advanced   NUTRITION DIAGNOSIS:   Inadequate oral intake related to acute illness as evidenced by per patient/family report.  GOAL:   Patient will meet greater than or equal to 90% of their needs  MONITOR:   PO intake,Supplement acceptance,Labs,Weight trends,Skin,I & O's  REASON FOR ASSESSMENT:   Malnutrition Screening Tool    ASSESSMENT:   70 y.o. female with medical history significant of chronic bronchitis, tobacco abuse, heart failure diagnosed one year ago but was lost to follow up and diverticulitis who is admitted with NSTEMI and CHF.   Unable to see pt today as pt in procedure at time of RD visit. Per chart review, pt reports poor appetite and oral intake for 1 day pta r/t nausea and vomiting. Pt NPO today for left heart cath. RD will add supplements once pt's diet is advanced. Per chart, pt appears weight stable pta.   Medications reviewed and include: aspirin, nicotine, NaCl @75ml /hr, heparin  Labs reviewed: Na 133(L) wbc- 14.4(H) cbgs- 123, 112 x 24 hrs AIC 6.1(H)- 3/6  NUTRITION - FOCUSED PHYSICAL EXAM: Unable to perform at this time   Diet Order:   Diet Order            Diet NPO time specified Except for: Sips with Meds  Diet effective midnight                EDUCATION NEEDS:   No education needs have been identified at this time  Skin:  Skin Assessment: Reviewed RN Assessment  Last BM:  PTA  Height:   Ht Readings from Last 1 Encounters:  07/11/2020 5' (1.524 m)    Weight:   Wt Readings from Last 1 Encounters:  07/11/2020 72.7 kg    Ideal Body Weight:  45.45 kg  BMI:  Body mass index is 31.3 kg/m.  Estimated Nutritional Needs:   Kcal:  1500-1700kcal/day  Protein:  75-85g/day  Fluid:  1.4L/day  08/20/20 MS, RD, LDN Please refer to [facility] for RD and/or RD on-call/weekend/after hours pager

## 2020-06-21 ENCOUNTER — Inpatient Hospital Stay (HOSPITAL_COMMUNITY)
Admit: 2020-06-21 | Discharge: 2020-06-21 | Disposition: A | Discharge status: Home / Self Care | Payer: Medicare HMO | Attending: Cardiovascular Disease | Admitting: Cardiovascular Disease

## 2020-06-21 ENCOUNTER — Inpatient Hospital Stay: Payer: Medicare HMO

## 2020-06-21 ENCOUNTER — Encounter: Payer: Self-pay | Admitting: Cardiovascular Disease

## 2020-06-21 DIAGNOSIS — I469 Cardiac arrest, cause unspecified: Secondary | ICD-10-CM | POA: Diagnosis not present

## 2020-06-21 DIAGNOSIS — I5031 Acute diastolic (congestive) heart failure: Secondary | ICD-10-CM

## 2020-06-21 DIAGNOSIS — I959 Hypotension, unspecified: Secondary | ICD-10-CM | POA: Diagnosis not present

## 2020-06-21 DIAGNOSIS — I214 Non-ST elevation (NSTEMI) myocardial infarction: Secondary | ICD-10-CM | POA: Diagnosis not present

## 2020-06-21 DIAGNOSIS — J9601 Acute respiratory failure with hypoxia: Secondary | ICD-10-CM

## 2020-06-21 DIAGNOSIS — Z9189 Other specified personal risk factors, not elsewhere classified: Secondary | ICD-10-CM

## 2020-06-21 DIAGNOSIS — D72825 Bandemia: Secondary | ICD-10-CM | POA: Diagnosis not present

## 2020-06-21 DIAGNOSIS — F17200 Nicotine dependence, unspecified, uncomplicated: Secondary | ICD-10-CM | POA: Diagnosis not present

## 2020-06-21 DIAGNOSIS — J9602 Acute respiratory failure with hypercapnia: Secondary | ICD-10-CM | POA: Diagnosis not present

## 2020-06-21 LAB — TROPONIN I (HIGH SENSITIVITY)
Troponin I (High Sensitivity): 12193 ng/L (ref <18)
Troponin I (High Sensitivity): 12737 ng/L (ref <18)

## 2020-06-21 LAB — LACTIC ACID, PLASMA
Lactic Acid, Venous: 1.6 mmol/L (ref 0.5–1.9)
Lactic Acid, Venous: 1.3 mmol/L (ref 0.5–1.9)

## 2020-06-21 LAB — BLOOD GAS, VENOUS
Acid-Base Excess: 11.2 mmol/L — ABNORMAL HIGH (ref 0.0–2.0)
Bicarbonate: 41.3 mmol/L — ABNORMAL HIGH (ref 20.0–28.0)
FIO2: 0.28
O2 Saturation: 57 %
Patient temperature: 37
pCO2, Ven: 82 mmHg (ref 44.0–60.0)
pH, Ven: 7.31 (ref 7.250–7.430)
pO2, Ven: 33 mmHg (ref 32.0–45.0)

## 2020-06-21 LAB — CBC
HCT: 45.3 % (ref 36.0–46.0)
HCT: 44.5 % (ref 36.0–46.0)
Hemoglobin: 14.3 g/dL (ref 12.0–15.0)
Hemoglobin: 13.9 g/dL (ref 12.0–15.0)
MCH: 30.4 pg (ref 26.0–34.0)
MCH: 30 pg (ref 26.0–34.0)
MCHC: 31.6 g/dL (ref 30.0–36.0)
MCHC: 31.2 g/dL (ref 30.0–36.0)
MCV: 96.2 fL (ref 80.0–100.0)
MCV: 96.1 fL (ref 80.0–100.0)
Platelets: 177 10*3/uL (ref 150–400)
Platelets: 175 10*3/uL (ref 150–400)
RBC: 4.71 MIL/uL (ref 3.87–5.11)
RBC: 4.63 MIL/uL (ref 3.87–5.11)
RDW: 12.5 % (ref 11.5–15.5)
RDW: 12.7 % (ref 11.5–15.5)
WBC: 13.1 10*3/uL — ABNORMAL HIGH (ref 4.0–10.5)
WBC: 13.6 10*3/uL — ABNORMAL HIGH (ref 4.0–10.5)
nRBC: 0 % (ref 0.0–0.2)
nRBC: 0 % (ref 0.0–0.2)

## 2020-06-21 LAB — RENAL FUNCTION PANEL
Albumin: 3.5 g/dL (ref 3.5–5.0)
Anion gap: 9 (ref 5–15)
BUN: 29 mg/dL — ABNORMAL HIGH (ref 8–23)
CO2: 33 mmol/L — ABNORMAL HIGH (ref 22–32)
Calcium: 8.7 mg/dL — ABNORMAL LOW (ref 8.9–10.3)
Chloride: 90 mmol/L — ABNORMAL LOW (ref 98–111)
Creatinine, Ser: 0.66 mg/dL (ref 0.44–1.00)
GFR, Estimated: >60 mL/min (ref >60)
Glucose, Bld: 102 mg/dL — ABNORMAL HIGH (ref 70–99)
Phosphorus: 3.5 mg/dL (ref 2.5–4.6)
Potassium: 4.4 mmol/L (ref 3.5–5.1)
Sodium: 132 mmol/L — ABNORMAL LOW (ref 135–145)

## 2020-06-21 LAB — COMPREHENSIVE METABOLIC PANEL
ALT: 48 U/L — ABNORMAL HIGH (ref 0–44)
AST: 92 U/L — ABNORMAL HIGH (ref 15–41)
Albumin: 3.7 g/dL (ref 3.5–5.0)
Alkaline Phosphatase: 50 U/L (ref 38–126)
Anion gap: 7 (ref 5–15)
BUN: 29 mg/dL — ABNORMAL HIGH (ref 8–23)
CO2: 36 mmol/L — ABNORMAL HIGH (ref 22–32)
Calcium: 8.7 mg/dL — ABNORMAL LOW (ref 8.9–10.3)
Chloride: 89 mmol/L — ABNORMAL LOW (ref 98–111)
Creatinine, Ser: 0.66 mg/dL (ref 0.44–1.00)
GFR, Estimated: >60 mL/min (ref >60)
Glucose, Bld: 100 mg/dL — ABNORMAL HIGH (ref 70–99)
Potassium: 4.1 mmol/L (ref 3.5–5.1)
Sodium: 132 mmol/L — ABNORMAL LOW (ref 135–145)
Total Bilirubin: 1.2 mg/dL (ref 0.3–1.2)
Total Protein: 6.8 g/dL (ref 6.5–8.1)

## 2020-06-21 LAB — ECHOCARDIOGRAM COMPLETE
AR max vel: 1.27 cm2
AV Area VTI: 1.58 cm2
AV Area mean vel: 1.16 cm2
AV Mean grad: 14 mmHg
AV Peak grad: 25 mmHg
Ao pk vel: 2.5 m/s
Area-P 1/2: 7.09 cm2
Height: 60 in
S' Lateral: 2.4 cm
Weight: 2564.39 oz

## 2020-06-21 LAB — AMMONIA: Ammonia: 24 umol/L (ref 9–35)

## 2020-06-21 LAB — BRAIN NATRIURETIC PEPTIDE: B Natriuretic Peptide: 259.5 pg/mL — ABNORMAL HIGH (ref 0.0–100.0)

## 2020-06-21 LAB — GLUCOSE, CAPILLARY
Glucose-Capillary: 101 mg/dL — ABNORMAL HIGH (ref 70–99)
Glucose-Capillary: 91 mg/dL (ref 70–99)
Glucose-Capillary: 95 mg/dL (ref 70–99)

## 2020-06-21 LAB — TSH: TSH: 0.424 u[IU]/mL (ref 0.350–4.500)

## 2020-06-21 LAB — MAGNESIUM: Magnesium: 1.8 mg/dL (ref 1.7–2.4)

## 2020-06-21 LAB — VITAMIN B12: Vitamin B-12: 171 pg/mL — ABNORMAL LOW (ref 180–914)

## 2020-06-21 MED ORDER — METOPROLOL TARTRATE 25 MG PO TABS: 12.5000 mg | ORAL_TABLET | Freq: Two times a day (BID) | ORAL | Status: DC

## 2020-06-21 MED ORDER — HEPARIN (PORCINE) 25000 UT/250ML-% IV SOLN
950.0000 [IU]/h | INTRAVENOUS | Status: DC
  Administered 2020-06-21: 950 [IU]/h via INTRAVENOUS
  Filled 2020-06-21: qty 250

## 2020-06-21 MED ORDER — NOREPINEPHRINE 4 MG/250ML-% IV SOLN: 0.0000 ug/min | INTRAVENOUS | Status: DC

## 2020-06-21 MED ORDER — DOBUTAMINE IN D5W 4-5 MG/ML-% IV SOLN
2.5000 ug/kg/min | INTRAVENOUS | Status: DC
  Filled 2020-06-21: qty 250

## 2020-06-21 MED ORDER — IPRATROPIUM-ALBUTEROL 0.5-2.5 (3) MG/3ML IN SOLN
3.0000 mL | Freq: Four times a day (QID) | RESPIRATORY_TRACT | Status: DC
  Administered 2020-06-21: 3 mL via RESPIRATORY_TRACT
  Filled 2020-06-21: qty 3

## 2020-06-21 MED ORDER — FENTANYL CITRATE (PF) 100 MCG/2ML IJ SOLN
INTRAMUSCULAR | Status: AC
  Administered 2020-06-21: 100 ug
  Filled 2020-06-21: qty 2

## 2020-06-21 MED ORDER — PROMETHAZINE HCL 25 MG/ML IJ SOLN: 12.5000 mg | Freq: Three times a day (TID) | INTRAMUSCULAR | Status: DC | PRN

## 2020-06-21 MED ORDER — FUROSEMIDE 10 MG/ML IJ SOLN
80.0000 mg | Freq: Once | INTRAMUSCULAR | Status: AC
  Administered 2020-06-21: 80 mg via INTRAVENOUS
  Filled 2020-06-21: qty 8

## 2020-06-21 MED ORDER — DOPAMINE-DEXTROSE 3.2-5 MG/ML-% IV SOLN
0.0000 ug/kg/min | INTRAVENOUS | Status: DC
  Administered 2020-06-21: 5 ug/kg/min via INTRAVENOUS
  Filled 2020-06-21: qty 250

## 2020-06-21 MED ORDER — FUROSEMIDE 10 MG/ML IJ SOLN
40.0000 mg | Freq: Every day | INTRAMUSCULAR | Status: DC
  Administered 2020-06-21: 40 mg via INTRAVENOUS
  Filled 2020-06-21: qty 4

## 2020-06-21 MED ORDER — ETOMIDATE 2 MG/ML IV SOLN
INTRAVENOUS | Status: AC
  Administered 2020-06-21: 20 mg
  Filled 2020-06-21: qty 10

## 2020-06-21 MED ORDER — NOREPINEPHRINE 4 MG/250ML-% IV SOLN
INTRAVENOUS | Status: AC
  Filled 2020-06-21: qty 250

## 2020-06-21 MED ORDER — VASOPRESSIN 20 UNITS/100 ML INFUSION FOR SHOCK
0.0000 [IU]/min | INTRAVENOUS | Status: DC
  Filled 2020-06-21: qty 100

## 2020-06-21 MED ORDER — VECURONIUM BROMIDE 10 MG IV SOLR
INTRAVENOUS | Status: AC
  Filled 2020-06-21: qty 10

## 2020-06-22 MED FILL — Medication: Qty: 1 | Status: AC

## 2020-07-15 NOTE — Procedures (Signed)
Central Venous Catheter Insertion Procedure Note  Brianna Barker  [id number]  07-Aug-1950  Date:07/11/20  Time:7:30 PM   Provider Performing:Marqui Formby Bea Laura Mikki [place]   Procedure: Insertion of Non-tunneled Central Venous [phone]) with US guidance ([id number])   Indication(s) Medication administration  Consent Risks of the procedure as well as the alternatives and risks of each were explained to the patient and/or caregiver.  Consent for the procedure was obtained and is signed in the bedside chart  Anesthesia Topical only with 1% lidocaine   Timeout Verified patient identification, verified procedure, site/side was marked, verified correct patient position, special equipment/implants available, medications/allergies/relevant history reviewed, required imaging and test results available.  Sterile Technique Maximal sterile technique including full sterile barrier drape, hand hygiene, sterile gown, sterile gloves, mask, hair covering, sterile ultrasound probe cover (if used).  Procedure Description Area of catheter insertion was cleaned with chlorhexidine and draped in sterile fashion.  With real-time ultrasound guidance a central venous catheter was placed into the right internal jugular vein. Nonpulsatile blood flow and easy flushing noted in all ports.  The catheter was sutured in place and sterile dressing applied.  Complications/Tolerance None; patient tolerated the procedure well. Chest X-ray is ordered to verify placement for internal jugular or subclavian cannulation.   Chest x-ray is not ordered for femoral cannulation.  EBL Minimal  Specimen(s) None

## 2020-07-15 NOTE — Procedures (Signed)
Endotracheal Intubation: Patient required placement of an artificial airway secondary to acute hypoxic respiratory failure   Consent: Emergent.    Hand washing performed prior to starting the procedure.    Medications administered for sedation prior to procedure:  Fentanyl 100 mcg IV, 20 mg Etomidate IV, Rocuronium 50 mg IV     A time out procedure was called and correct patient, name, & ID confirmed. Needed supplies and equipment were assembled and checked to include ETT, 10 ml syringe, Glidescope, Mac and Miller blades, suction, oxygen and bag mask valve, end tidal CO2 monitor.    Patient was positioned to align the mouth and pharynx to facilitate visualization of the glottis.    Heart rate, SpO2 and blood pressure was continuously monitored during the procedure. Pre-oxygenation was conducted prior to intubation and endotracheal tube was placed through the vocal cords into the trachea.       The artificial airway was placed under direct visualization via glidescope route using a 7.5 cm ETT on the first attempt.   ETT was secured at 22 cm.   Placement was confirmed by auscuitation of lungs with good breath sounds bilaterally and no stomach sounds.  Condensation was noted on endotracheal tube.   CO2 detector in place with appropriate color change.    Complications: None .        Chest radiograph ordered and pending.   Marda Stalker, [place] Pager [phone] (please enter 7 digits) PCCM Consult Pager [phone] (please enter 7 digits)

## 2020-07-15 NOTE — Progress Notes (Signed)
Asked to come to bedside to evaluate patient by patient's RN. Apparently, patient desaturated to 54% about two hours earlier and started on NRB. He saturation improved and she was weaned to 2L by [place]. Cardiology was contacted as patient endorsed chest pain radiating to LUQ.  On my arrival, patient saturating in 90's on 2L.  BP 93/71.  HR in 90s to 100.  Respiratory rate within normal.  Patient states she had some nausea and chest pain radiating to her LUQ earlier but not anymore.  She denies shortness of breath, nausea, vomiting or abdominal pain.  She is awake but not fully alert.  She is oriented x including situation but not to date.  Morphine 2 mg administered about 9:40 AM this morning.  Vitals:   07/10/2020 1446 06/20/2020 1456 06/24/2020 1500 06/20/2020 1600  BP:   90/68 92/68  Pulse:   94 96  Resp:   17 15  Temp:      TempSrc:      SpO2: (!) 82% 100% 100% 98%  Weight:      Height:       GENERAL: No apparent distress.  Nontoxic. HEENT: MMM.  Vision and hearing grossly intact.  NECK: Supple.  No apparent JVD.  RESP:  No IWOB.  Fair aeration bilaterally. CVS:  RRR. Heart sounds normal.  Slightly delayed cap refills ABD/GI/GU: BS+. Abd soft, NTND.  MSK/EXT:  Moves extremities. No apparent deformity. No edema.  Warm to touch. SKIN: no apparent skin lesion or wound NEURO: Awake but not quite alert.  Oriented x5 except to date.  No apparent focal neuro deficit. PSYCH: Calm. Normal affect.  Assessment and plan Acute respiratory failure with hypoxia-seems to desaturate when she falls asleep although she was noted to desaturate even while awake per RN.  She seems to be a mouth breather as well.  The fact that her oxygenation recovers quickly suggest that she has bad sleep apnea.  VBG 7.31/82/33/41 suggesting compensated chronic respiratory acidosis, although CO2 could be overestimated by VBG.  She has slightly delayed cap refills but extremities feels warm to raise concern for hypoperfusion.  She had  some chest pain radiating to LUQ that has resolved.  Abdominal exam is benign.  Her lactate is within normal.  Her D-dimer was negative on admission.  She was also on anticoagulation before she transition to subcu Lovenox.  EKG shows slightly pronounced ST elevation in inferolateral leads compared to her prior EKGs but she is chest pain-free.  Vitals within normal as well. She is awake but not quite alert.  She is fully oriented except to date.  She has IV morphine about 9:40 AM this morning which could contribute to her mental status and breathing.  Obviously very dangerous in the setting of sleep apnea.  No apparent focal neuro deficit CVA.  -Start BiPAP-she is awake enough to protect her airway.  Needs BiPAP at night and when she naps -Avoid any sedating medications. -We will check troponin to ensure it is not uptrending -Check chest x-ray, ammonia and B12 levels -Changed Coreg to metoprolol for better cardiac selectivity -Scheduled DuoNeb.  Could add systemic steroid if no improvement but she does not have dyspnea or wheeze. -Appreciate help by cardiology  See progress note from earlier this morning for other medical issues.

## 2020-07-15 NOTE — Progress Notes (Signed)
Dr. Currie Paris at bedside for central line placement - emergent consent d/t cardiogenic shock per MD. Dopamine started per order.

## 2020-07-15 NOTE — Progress Notes (Signed)
Around 1430, pt was awake watching TV when she spontaneously desaturated to 54% on 1L [place]. Prior to this event, pt has been 96-100% on 1L [place]. Accompanied by the desaturation to 54%, pt was diaphoretic, c/o sudden onset of ABD pain radiating to lower chest, and nausea without emesis. Pt stated during this time "I think I'm going to die." Non-rebreather at 15L was put on pt, and her O2 sat recovered to 100%. Once recovered, she was transitioned to 2L [place]. Zofran was given d/t nausea, and relieved after about 30 minutes. Pt extremely lethargic, and stating "I don't understand why I'm so tired... I can't stay awake!" Dr. Kirke Corin & Dr. Okey Dupre, cardiology, notified to come to bedside to assess pt. 12 lead EKG was obtained stating "acute MI." Dr. Okey Dupre at bedside to assess pt, and reviewed 12 lead EKG. Dr. Okey Dupre states no further interventions at this time. Dr. Alanda Slim notified to come to bedside to assess pt as well. Venous blood gas obtained, reading elevated CO2, thus pt being placed on bi-pap. Pt educated to remove mask if feeling nauseated. ABD/chest pain resolved at this time. Pt resting comfortably on bi-pap at this time. Nurse expressed concerns regarding clinical decline of pt, and requested a critical care consult from Dr. Alanda Slim.

## 2020-07-15 NOTE — Progress Notes (Signed)
Patient seems to be stable on BiPAP. Seems to be more alert already. CXR with mild pulmonary congestion which seems to have improved on my review. Troponin trended down. Discussed by the intensivist who will look into her case.

## 2020-07-15 NOTE — Progress Notes (Signed)
Patient is feeling better, HR down. Currently on NRB Denies CP. EKG, Xray, and ABG done.

## 2020-07-15 NOTE — Progress Notes (Signed)
Chaplain offered spiritual emotional support. Patient deceased.

## 2020-07-15 NOTE — Significant Event (Cosign Needed)
Cardiac Arrest   Pt PEA arrested at 1919 requiring initiation of ACLS protocol with ROSC at 1921. Following ROSC pt became agitated and remained hypoxic/cyanotic, therefore requiring mechanical intubation at 1928.  Pt PEA arrest again at 1930, and ACLS protocol initiated.  She received multiple rounds of epinephrine along with amps of sodium bicarb and required defibrillation (see code sheet for details).  Bedside echo performed by ICU Intensivist Dr. Francene Boyers which revealed absent cardiac wall motion.  Despite CPR and ACLS medications unable to achieve ROSC. Pt expired on 26-Jun-2020 at 1948 pts family at bedside and notified of time of death.   Sonda Rumble, AGNP  Pulmonary/Critical Care Pager [phone] (please enter 7 digits) PCCM Consult Pager [phone] (please enter 7 digits)

## 2020-07-15 NOTE — Death Summary Note (Signed)
DEATH SUMMARY   Patient Details  Name: Brianna Barker MRN: [id number] DOB: Apr 09, 1951  Admission/Discharge Information   Admit Date:  07/17/20  Date of Death:  July 19, 2020  Time of Death:  1948  Length of Stay: 2  Referring Physician: Patient, No Pcp Per   Reason(s) for Hospitalization  Chest Pressure  Diagnoses  Preliminary cause of death: Ischemic cardiomyopathy Secondary Diagnoses (including complications and co-morbidities):  Active Problems:   NSTEMI (non-ST elevated myocardial infarction) (HCC)   Acute respiratory failure with hypoxia and hypercapnia [facility])     Brief Hospital Course (including significant findings, care, treatment, and services provided and events leading to death)  Brianna Barker is a 70 y.o. year old female who presented to [facility] ER with chest pressure and nausea that had been going on for several days on 07-17-2020.  She also endorsed poor appetite.  She was found to have a STEMI.  She was taken to the Cath Lab. 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  As well as moderate ostial and mid RCA disease.  EF was low normal with noted inferior lateral hypokinesis and mild to moderate mitral regurgitation. Cardiology recommended medical management, and recommended fractional flow reserve evaluation along with possible PCI of the RCA in 2 to 4 week once she recovered from current MI.    Hospital course complicated by episodes of acute hypoxia with O2 sats in the 60's requiring NRB on Jul 19, 2020.  Pt received 2 doses of lasix on July 19, 2020, and became hypotensive requiring dopamine gtt.  PCCM team consulted to assist with management.  However, on Jul 19, 2020 pt PEA arrested x2 and required mechanical intubation.  Despite ACLS protocol unable to achieve ROSC during second cardiac arrest and pt expired on 07-19-20 at 1948.   Pertinent Labs and Studies  Significant Diagnostic Studies DG Chest 1 View  Result Date: 2020/07/18 CLINICAL  DATA:  Shortness of breath. EXAM: CHEST  1 VIEW COMPARISON:  Radiograph yesterday. FINDINGS: Again seen hyperinflation and bronchial thickening. Bronchial thickening may have increased from prior exam. Interstitial thickening is also seen. No developing airspace disease. Stable heart size and mediastinal contours. No pneumothorax or pleural effusion. IMPRESSION: Bronchial thickening and hyperinflation suggesting COPD. Bronchial thickening may have increased from yesterday. No developing airspace disease. Electronically Signed   By: Narda Rutherford M.D.   On: 18-Jul-2020 23:48   DG Chest 2 View  Result Date: 2020/07/17 CLINICAL DATA:  Shortness of breath. EXAM: CHEST - 2 VIEW COMPARISON:  08/17/2019 and prior exams FINDINGS: UPPER limits normal heart size noted. 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 Mild interstitial opacities are now identified. There is no evidence of consolidation, pleural effusion or pneumothorax. No acute bony abnormalities are present. IMPRESSION: Mild interstitial opacities-question interstitial edema or infection. COPD/emphysema changes. Electronically Signed   By: Harmon Pier M.D.   On: 07-17-20 13:18   CARDIAC CATHETERIZATION  Result Date: 07-18-2020  Prox LAD to Mid LAD lesion is 40% stenosed.  Prox Cx to Mid Cx lesion is 100% stenosed.  The left ventricular ejection fraction is 35-45% by visual estimate.  There is moderate left ventricular systolic dysfunction.  LV end diastolic pressure is moderately elevated.  Ost RCA to Prox RCA lesion is 60% stenosed.  Mid RCA lesion is 60% stenosed.  1.  Significant two-vessel coronary artery disease with occluded mid left circumflex after the origin of a small OM1 branch.  There seems to be a large organized thrombus in the left circumflex and this occlusion  seems to be more than 48 hours in duration.  In addition, there is borderline significant disease in ostial right coronary artery extending to the mid segment. 2.  Moderately reduced  LV systolic function with an EF of 35 to 40% with significant inferior apical hypokinesis. 3.  Moderately elevated left ventricular end-diastolic pressure at 23 mmHg. Recommendations: Suspect that the left circumflex has been occluded for more than 48 hours correlating with onset of symptoms on Saturday morning.  Given that currently she is chest pain-free, there is no benefit from revascularizing the left circumflex at the present time. Recommend medical therapy for now.  I added clopidogrel. Recommend fractional flow reserve evaluation and possible PCI of the right coronary artery in 2 to 4 weeks once she recovers from her current myocardial infarction.   DG Chest Port 1 View  Result Date: 2020-06-25 CLINICAL DATA:  Check central line placement EXAM: PORTABLE CHEST 1 VIEW COMPARISON:  Film from earlier in the same day. FINDINGS: New right jugular central line is noted with catheter tip in the mid superior vena cava. No pneumothorax is seen. Cardiac shadow is stable. Small effusions are again noted. Mild central vascular congestion is again seen. IMPRESSION: Mild changes of CHF. No pneumothorax following new central line placement. Electronically Signed   By: Alcide Clever M.D.   On: 06-25-20 19:12   DG Chest Port 1 View  Result Date: 06-25-20 CLINICAL DATA:  Shortness of breath EXAM: PORTABLE CHEST 1 VIEW COMPARISON:  06/17/2020 FINDINGS: Cardiac shadow is enlarged but stable. Aortic calcifications are again seen. Increasing small effusions are noted bilaterally. Mild vascular congestion is noted. No focal confluent infiltrate is seen. No bony abnormality is noted. IMPRESSION: Mild increased vascular congestion and small effusions likely related to early CHF. Electronically Signed   By: Alcide Clever M.D.   On: 2020/06/25 17:22   ECHOCARDIOGRAM COMPLETE  Result Date: Jun 25, 2020    ECHOCARDIOGRAM REPORT   Patient Name:   Brianna Barker Date of Exam: Jun 25, 2020 Medical Rec #:  [id number]     Height:        60.0 in Accession #:    [id number]    Weight:       160.3 lb Date of Birth:  [birth year]     BSA:          1.699 m Patient Age:    70 years      BP:           97/74 mmHg Patient Gender: F             HR:           102 bpm. Exam Location:  [facility] Procedure: 2D Echo, Cardiac Doppler, Color Doppler and Strain Analysis Indications:     NSTEMI I21.4  History:         Patient has no prior history of Echocardiogram examinations.                  Tobacco abuse.  Sonographer:     Cristela Blue RDCS (AE) Referring Phys:  [id number] [facility] Diagnosing Phys: Yvonne Kendall MD  Sonographer Comments: Global longitudinal strain was attempted. IMPRESSIONS  1. Left ventricular ejection fraction, by estimation, is 50 to 55%. The left ventricle has low normal function. The left ventricle demonstrates regional wall motion abnormalities (see scoring diagram/findings for description). There is mild left ventricular hypertrophy. Left ventricular diastolic parameters are consistent with Grade II diastolic dysfunction (pseudonormalization). Elevated left atrial pressure. There is severe hypokinesis  of the left ventricular, basal-mid inferolateral wall.  2. Right ventricular systolic function is normal. The right ventricular size is normal. There is mildly elevated pulmonary artery systolic pressure.  3. The mitral valve is abnormal. Mild to moderate mitral valve regurgitation. No evidence of mitral stenosis.  4. The aortic valve has an indeterminant number of cusps. There is mild calcification of the aortic valve. There is moderate thickening of the aortic valve. Aortic valve regurgitation is trivial. Mild aortic valve stenosis. Aortic valve mean gradient measures 14.0 mmHg.  5. The inferior vena cava is dilated in size with <50% respiratory variability, suggesting right atrial pressure of 15 mmHg. FINDINGS  Left Ventricle: Left ventricular ejection fraction, by estimation, is 50 to 55%. The left ventricle has low normal function. The left  ventricle demonstrates regional wall motion abnormalities. Severe hypokinesis of the left ventricular, basal-mid inferolateral wall. The left ventricular internal cavity size was normal in size. There is mild left ventricular hypertrophy. Left ventricular diastolic parameters are consistent with Grade II diastolic dysfunction (pseudonormalization). Elevated left atrial pressure. Right Ventricle: The right ventricular size is normal. No increase in right ventricular wall thickness. Right ventricular systolic function is normal. There is mildly elevated pulmonary artery systolic pressure. The tricuspid regurgitant velocity is 2.67  m/s, and with an assumed right atrial pressure of 15 mmHg, the estimated right ventricular systolic pressure is 43.5 mmHg. Left Atrium: Left atrial size was normal in size. Right Atrium: Right atrial size was normal in size. Pericardium: There is no evidence of pericardial effusion. Mitral Valve: The mitral valve is abnormal. There is mild thickening of the mitral valve leaflet(s). Mild to moderate mitral valve regurgitation. No evidence of mitral valve stenosis. Tricuspid Valve: The tricuspid valve is not well visualized. Tricuspid valve regurgitation is trivial. Aortic Valve: The aortic valve has an indeterminant number of cusps. There is mild calcification of the aortic valve. There is moderate thickening of the aortic valve. Aortic valve regurgitation is trivial. Mild aortic stenosis is present. Aortic valve mean gradient measures 14.0 mmHg. Aortic valve peak gradient measures 25.0 mmHg. Aortic valve area, by VTI measures 1.58 cm. Pulmonic Valve: The pulmonic valve was not well visualized. Pulmonic valve regurgitation is not visualized. Aorta: The aortic root is normal in size and structure. Pulmonary Artery: The pulmonary artery is of normal size. Venous: The inferior vena cava is dilated in size with less than 50% respiratory variability, suggesting right atrial pressure of 15 mmHg.  IAS/Shunts: The interatrial septum was not well visualized.  LEFT VENTRICLE PLAX 2D LVIDd:         4.10 cm  Diastology LVIDs:         2.40 cm  LV e' medial:    6.64 cm/s LV PW:         1.16 cm  LV E/e' medial:  15.8 LV IVS:        0.97 cm  LV e' lateral:   5.11 cm/s LVOT diam:     2.00 cm  LV E/e' lateral: 20.5 LV SV:         65 LV SV Index:   38 LVOT Area:     3.14 cm  RIGHT VENTRICLE RV Basal diam:  2.91 cm RV S prime:     12.70 cm/s TAPSE (M-mode): 2.4 cm LEFT ATRIUM             Index       RIGHT ATRIUM           Index LA  diam:        3.60 cm 2.12 cm/m  RA Area:     10.80 cm LA Vol (A2C):   64.0 ml 37.67 ml/m RA Volume:   23.80 ml  14.01 ml/m LA Vol (A4C):   38.3 ml 22.54 ml/m LA Biplane Vol: 51.4 ml 30.25 ml/m  AORTIC VALVE                    PULMONIC VALVE AV Area (Vmax):    1.27 cm     PV Vmax:        0.50 m/s AV Area (Vmean):   1.16 cm     PV Peak grad:   1.0 mmHg AV Area (VTI):     1.58 cm     RVOT Peak grad: 6 mmHg AV Vmax:           250.00 cm/s AV Vmean:          173.667 cm/s AV VTI:            0.411 m AV Peak Grad:      25.0 mmHg AV Mean Grad:      14.0 mmHg LVOT Vmax:         101.00 cm/s LVOT Vmean:        64.200 cm/s LVOT VTI:          0.207 m LVOT/AV VTI ratio: 0.50  AORTA Ao Root diam: 2.43 cm MITRAL VALVE                TRICUSPID VALVE MV Area (PHT): 7.09 cm     TR Peak grad:   28.5 mmHg MV Decel Time: 107 msec     TR Vmax:        267.00 cm/s MV E velocity: 105.00 cm/s MV A velocity: 104.00 cm/s  SHUNTS MV E/A ratio:  1.01         Systemic VTI:  0.21 m                             Systemic Diam: 2.00 cm Yvonne Kendallhristopher End MD Electronically signed by Yvonne Kendallhristopher End MD Signature Date/Time: 06/23/2020/10:07:16 AM    Final     Microbiology Recent Results (from the past 240 hour(s))  Resp Panel by RT-PCR (Flu A&B, Covid) Nasopharyngeal Swab     Status: None   Collection Time: 07/13/2020  1:55 PM   Specimen: Nasopharyngeal Swab; Nasopharyngeal(NP) swabs in vial transport medium  Result Value  Ref Range Status   SARS Coronavirus 2 by RT PCR NEGATIVE NEGATIVE Final    Comment: (NOTE) SARS-CoV-2 target nucleic acids are NOT DETECTED.  The SARS-CoV-2 RNA is generally detectable in upper respiratory specimens during the acute phase of infection. The lowest concentration of SARS-CoV-2 viral copies this assay can detect is 138 copies/mL. A negative result does not preclude SARS-Cov-2 infection and should not be used as the sole basis for treatment or other patient management decisions. A negative result may occur with  improper specimen collection/handling, submission of specimen other than nasopharyngeal swab, presence of viral mutation(s) within the areas targeted by this assay, and inadequate number of viral copies(<138 copies/mL). A negative result must be combined with clinical observations, patient history, and epidemiological information. The expected result is Negative.  Fact Sheet for Patients:  BloggerCourse.comhttps://www.fda.gov/media/152166/download  Fact Sheet for Healthcare Providers:  SeriousBroker.ithttps://www.fda.gov/media/152162/download  This test is no t yet approved or cleared by the [place] FDA and  has  been authorized for detection and/or diagnosis of SARS-CoV-2 by FDA under an Emergency Use Authorization (EUA). This EUA will remain  in effect (meaning this test can be used) for the duration of the COVID-19 declaration under Section 564(b)(1) of the Act, 21 U.S.C.section 360bbb-3(b)(1), unless the authorization is terminated  or revoked sooner.       Influenza A by PCR NEGATIVE NEGATIVE Final   Influenza B by PCR NEGATIVE NEGATIVE Final    Comment: (NOTE) The Xpert Xpress SARS-CoV-2/FLU/RSV plus assay is intended as an aid in the diagnosis of influenza from Nasopharyngeal swab specimens and should not be used as a sole basis for treatment. Nasal washings and aspirates are unacceptable for Xpert Xpress SARS-CoV-2/FLU/RSV testing.  Fact Sheet for  Patients: BloggerCourse.com  Fact Sheet for Healthcare Providers: SeriousBroker.it  This test is not yet approved or cleared by the [place] FDA and has been authorized for detection and/or diagnosis of SARS-CoV-2 by FDA under an Emergency Use Authorization (EUA). This EUA will remain in effect (meaning this test can be used) for the duration of the COVID-19 declaration under Section 564(b)(1) of the Act, 21 U.S.C. section 360bbb-3(b)(1), unless the authorization is terminated or revoked.  Performed at [facility], [address] [place], [place] [address]   MRSA PCR Screening     Status: None   Collection Time: 07/07/2020  5:16 PM   Specimen: Nasal Mucosa; Nasopharyngeal  Result Value Ref Range Status   MRSA by PCR NEGATIVE NEGATIVE Final    Comment:        The GeneXpert MRSA Assay (FDA approved for NASAL specimens only), is one component of a comprehensive MRSA colonization surveillance program. It is not intended to diagnose MRSA infection nor to guide or monitor treatment for MRSA infections. Performed at [facility], [address] [place], [place] [address]     Lab Basic Metabolic Panel: Recent Labs  Lab 06/23/2020 1326 2020/07/17 0611 06/18/2020 1252 06/20/2020 1901  NA 133* 133* 132* 132*  K 4.4 4.6 4.4 4.1  CL 95* 94* 90* 89*  CO2 28 29 33* 36*  GLUCOSE 121* 129* 102* 100*  BUN 14 18 29* 29*  CREATININE 0.61 0.62 0.66 0.66  CALCIUM 9.2 8.6* 8.7* 8.7*  MG  --   --  1.8  --   PHOS  --   --  3.5  --    Liver Function Tests: Recent Labs  Lab 06/20/2020 1252 06/19/2020 1901  AST  --  92*  ALT  --  48*  ALKPHOS  --  50  BILITOT  --  1.2  PROT  --  6.8  ALBUMIN 3.5 3.7   No results for input(s): LIPASE, AMYLASE in the last 168 hours. Recent Labs  Lab 06/15/2020 1644  AMMONIA 24   CBC: Recent Labs  Lab 07/09/2020 1235 Jul 17, 2020 0611 07/11/2020 0436 06/18/2020 1901  WBC 17.0* 14.4*  13.1* 13.6*  HGB 16.7* 15.1* 14.3 13.9  HCT 49.7* 46.3* 45.3 44.5  MCV 90.2 94.1 96.2 96.1  PLT 261 141* 177 175   Cardiac Enzymes: No results for input(s): CKTOTAL, CKMB, CKMBINDEX, TROPONINI in the last 168 hours. Sepsis Labs: Recent Labs  Lab 06/15/2020 1235 2020/07/17 0611 07/02/2020 0436 07/06/2020 1630 07/03/2020 1901 07/02/2020 1902  WBC 17.0* 14.4* 13.1*  --  13.6*  --   LATICACIDVEN  --   --   --  1.6  --  1.3    Procedures/Operations  Right Internal Jugular Central Line Cardiac Catheterization Mechanical  Intubation  Sonda Rumble, AGNP  Pulmonary/Critical Care Pager [phone] (please enter 7 digits) PCCM Consult Pager [phone] (please enter 7 digits)

## 2020-07-15 NOTE — Consult Note (Signed)
ANTICOAGULATION CONSULT NOTE - Initial Consult  Pharmacy Consult for Heparin infusion  Indication: chest pain/ACS  Allergies  Allergen Reactions  . Aleve [Naproxen] Itching and Rash    Patient Measurements: Height: 5' (152.4 cm) Weight: 72.7 kg (160 lb 4.4 oz) IBW/kg (Calculated) : 45.5 Heparin Dosing  Weight: 61.6 kg   Vital Signs: Temp: 97.2 F (36.2 C) (03/08 1200) Temp Source: Oral (03/08 1200) BP: 87/67 (03/08 1815) Pulse Rate: 104 (03/08 1815)  Labs: Recent Labs    2020/07/06 1235 07/06/20 1326 July 06, 2020 1550 07/06/2020 2309 07/05/2020 0611 07/02/2020 1053 06/25/2020 1506 06/24/2020 0436 06/19/2020 1252 06/15/2020 1630  HGB 16.7*  --   --   --  15.1*  --   --  14.3  --   --   HCT 49.7*  --   --   --  46.3*  --   --  45.3  --   --   PLT 261  --   --   --  141*  --   --  177  --   --   LABPROT  --   --   --   --  13.1  --   --   --   --   --   INR  --   --   --   --  1.0  --   --   --   --   --   HEPARINUNFRC  --   --   --  0.26* 0.29*  --   --   --   --   --   CREATININE  --  0.61  --   --  0.62  --   --   --  0.66  --   TROPONINIHS 24,307*  --    < >  --   --  25,969* 20,881*  --   --  12,193*   < > = values in this interval not displayed.    Estimated Creatinine Clearance: 58.3 mL/min (by C-G formula based on SCr of 0.66 mg/dL).   Medical History: Past Medical History:  Diagnosis Date  . Chronic bronchitis (HCC)   . Diverticulitis   . Morbid obesity (HCC)   . Tobacco abuse     Medications:  Enoxaparin 40 mg Ingenio Q24H -- last dose 3/8 0937  Assessment: 70 y.o.femalewho originally presented to the emergency department 06-Jul-2020 with chest heaviness and sob. Patient started on heparin infusion for NSTEMI.  Patient underwent cardiac catherization 07/14/2020 that showed late presenting STEMI -- medical management planned and heparin discontinued. 3/8: patient with spontaneous desaturation and abd pain radiating to chest. Transferred to ICU. Concern for cardiogenic shock.  Pharmacy has been consulted to re-initiate heparin drip.   Heparin Dosing  Weight: 61.6 kg    Goal of Therapy:  Heparin level 0.3-0.7 units/ml Monitor platelets by anticoagulation protocol: Yes   Plan:  Start heparin infusion at 950 units/hr (last rate of previous heparin infusion) Check anti-Xa level in 6 hours and daily while on heparin Continue to monitor H&H and platelets  Sharen Hones, PharmD, BCPS 06/17/2020,6:27 PM

## 2020-07-15 NOTE — Progress Notes (Signed)
Progress Note  Patient Name: Brianna Barker Date of Encounter: 06/26/2020  Primary Cardiologist: New to [facility] - consult by Arida  Subjective   No chest pain.  She did note increased shortness of breath with hypoxia last evening and was given IV Lasix 40 mg x 1 with minimal urine output noted.  ABG last evening did show significant hypercapnia with a PCO2 of 83.  Repeat chest x-ray showed stable vascular congestion with no new focal infiltrates as well as hyperinflation.  BiPAP at bedside, not currently wearing.  Inpatient Medications    Scheduled Meds: . aspirin EC  81 mg Oral Daily  . atorvastatin  80 mg Oral Daily  . carvedilol  3.125 mg Oral BID WC  . Chlorhexidine Gluconate Cloth  6 each Topical Daily  . clopidogrel  75 mg Oral Q breakfast  . enoxaparin (LOVENOX) injection  40 mg Subcutaneous Q24H  . furosemide  40 mg Intravenous Daily  . LORazepam  1 mg Intravenous Once  . mouth rinse  15 mL Mouth Rinse BID  . nicotine  21 mg Transdermal Daily  . sodium chloride flush  3 mL Intravenous Q12H  . sodium chloride flush  3 mL Intravenous Q12H   Continuous Infusions: . sodium chloride    . nitroGLYCERIN Stopped (07/03/2020 0924)   PRN Meds: sodium chloride, acetaminophen, ipratropium-albuterol, morphine injection, nitroGLYCERIN, ondansetron (ZOFRAN) IV, sodium chloride flush   Vital Signs    Vitals:   07/08/2020 0800 07/01/2020 0900 07/01/2020 1000 07/09/2020 1100  BP: 97/74 90/64 95/74  93/70  Pulse: (!) 102 98 92 98  Resp: (!) 21 15 15 20   Temp: 98 F (36.7 C)     TempSrc: Oral     SpO2: 100% 100% 98% 97%  Weight:      Height:        Intake/Output Summary (Last 24 hours) at 06/14/2020 1152 Last data filed at 06/17/2020 0800 Gross per 24 hour  Intake 1388.75 ml  Output 700 ml  Net 688.75 ml   Filed Weights   07/03/2020 1230 03-Jul-2020 1700 07/08/2020 1207  Weight: 72.6 kg 72.7 kg 72.7 kg    Telemetry    SR with PVCs - Personally Reviewed  ECG    No new tracings  available for review - Personally Reviewed  Physical Exam   GEN: No acute distress.   Neck: JVD difficult to assess with current body position. Cardiac: RRR, III/VI systolic murmur at the base, no rubs, or gallops.  Respiratory:  Poor inspiratory effort with diminished breath sounds bilaterally.  GI: Soft, nontender, non-distended.   MS: No edema; No deformity. Neuro:   Somnolent though will open eyes and respond to questions appropriately.    Psych: Somnolent.  Labs    Chemistry Recent Labs  Lab 2020-07-03 1326 07/13/2020 0611  NA 133* 133*  K 4.4 4.6  CL 95* 94*  CO2 28 29  GLUCOSE 121* 129*  BUN 14 18  CREATININE 0.61 0.62  CALCIUM 9.2 8.6*  GFRNONAA >60 >60  ANIONGAP 10 10     Hematology Recent Labs  Lab 2020-07-03 1235 07/09/2020 0611 06/16/2020 0436  WBC 17.0* 14.4* 13.1*  RBC 5.51* 4.92 4.71  HGB 16.7* 15.1* 14.3  HCT 49.7* 46.3* 45.3  MCV 90.2 94.1 96.2  MCH 30.3 30.7 30.4  MCHC 33.6 32.6 31.6  RDW 12.5 12.5 12.5  PLT 261 141* 177    Cardiac EnzymesNo results for input(s): TROPONINI in the last 168 hours. No results for input(s): TROPIPOC in  the last 168 hours.   BNP Recent Labs  Lab 07/15/2020 1550  BNP 322.4*     DDimer  Recent Labs  Lab Jul 15, 2020 1550  DDIMER 0.41     Radiology    DG Chest 1 View  Result Date: 06/20/2020 IMPRESSION: Bronchial thickening and hyperinflation suggesting COPD. Bronchial thickening may have increased from yesterday. No developing airspace disease. Electronically Signed   By: Narda Rutherford M.D.   On: 07/14/2020 23:48   DG Chest 2 View  Result Date: Jul 15, 2020 IMPRESSION: Mild interstitial opacities-question interstitial edema or infection. COPD/emphysema changes. Electronically Signed   By: Harmon Pier M.D.   On: Jul 15, 2020 13:18   Cardiac Studies   LHC 06/14/2020:   Prox LAD to Mid LAD lesion is 40% stenosed.  Prox Cx to Mid Cx lesion is 100% stenosed.  The left ventricular ejection fraction is 35-45% by  visual estimate.  There is moderate left ventricular systolic dysfunction.  LV end diastolic pressure is moderately elevated.  Ost RCA to Prox RCA lesion is 60% stenosed.  Mid RCA lesion is 60% stenosed.   1.  Significant two-vessel coronary artery disease with occluded mid left circumflex after the origin of a small OM1 branch.  There seems to be a large organized thrombus in the left circumflex and this occlusion seems to be more than 48 hours in duration.  In addition, there is borderline significant disease in ostial right coronary artery extending to the mid segment. 2.  Moderately reduced LV systolic function with an EF of 35 to 40% with significant inferior apical hypokinesis. 3.  Moderately elevated left ventricular end-diastolic pressure at 23 mmHg.  Recommendations: Suspect that the left circumflex has been occluded for more than 48 hours correlating with onset of symptoms on Saturday morning.  Given that currently she is chest pain-free, there is no benefit from revascularizing the left circumflex at the present time. Recommend medical therapy for now.  I added clopidogrel. Recommend fractional flow reserve evaluation and possible PCI of the right coronary artery in 2 to 4 weeks once she recovers from her current myocardial infarction. __________  2D echo 06/24/2020: 1. Left ventricular ejection fraction, by estimation, is 50 to 55%. The  left ventricle has low normal function. The left ventricle demonstrates  regional wall motion abnormalities (see scoring diagram/findings for  description). There is mild left  ventricular hypertrophy. Left ventricular diastolic parameters are  consistent with Grade II diastolic dysfunction (pseudonormalization).  Elevated left atrial pressure. There is severe hypokinesis of the left  ventricular, basal-mid inferolateral wall.  2. Right ventricular systolic function is normal. The right ventricular  size is normal. There is mildly elevated  pulmonary artery systolic  pressure.  3. The mitral valve is abnormal. Mild to moderate mitral valve  regurgitation. No evidence of mitral stenosis.  4. The aortic valve has an indeterminant number of cusps. There is mild  calcification of the aortic valve. There is moderate thickening of the  aortic valve. Aortic valve regurgitation is trivial. Mild aortic valve  stenosis. Aortic valve mean gradient  measures 14.0 mmHg.  5. The inferior vena cava is dilated in size with <50% respiratory  variability, suggesting right atrial pressure of 15 mmHg.   Patient Profile     70 y.o. female with history of chronic bronchitis, diverticulitis, and tobacco use who presented on 3/6 with continuous chest pain that initially began on 3/5 and was found to have a late presenting posterior ST elevation MI with troponin greater than 24,000  upon presentation.  Assessment & Plan    1.  Late presenting posterior ST elevation MI: -Chest pain-free, undergoing cardiac cath on 3/7 which showed an occluded LCx with a large organized thrombus which is thought to be the likely culprit for her late presenting posterior MI that was silent on EKG.  Given onset of greater than 48 hours prior, and in the setting of being chest pain-free, there was no benefit from revascularization of the LCx and medical management was recommended.  She was noted to have significant residual disease affecting the ostial, proximal, and mid RCA with recommendation to proceed with a staged FFR evaluation of the RCA with possible PCI in approximately 2 to 4 weeks to allow for her to recover from her current MI -Continue DAPT with ASA and clopidogrel -Coreg as BP allows -Atorvastatin -Post-cath instructions  2.  Diastolic CHF: -She does appear volume overloaded -Continue IV Lasix as BP allows -Daily weights -Strict I's and O's  3.  HLD: -LDL 95 this admission with goal being less than 70 -Atorvastatin 80 mg  4.  Chronic bronchitis  with significant hypercapnia and ongoing tobacco use: -BiPAP -Desaturations noted overnight, suspect these are more pulmonary in etiology given no evidence of low output -Complete cessation of tobacco is recommended -Management per primary service  5.  Mild to moderate MR with mild aortic stenosis: -Diurese as above -Follow-up as outpatient   For questions or updates, please contact [facility] Please consult [email or web address] for contact info under Cardiology/STEMI.    Signed, Eula Listen, PA-C [facility] Pager: [phone] 06/14/2020, 11:52 AM

## 2020-07-15 NOTE — Progress Notes (Signed)
PROGRESS NOTE  Brianna Barker VEL:[id number] DOB: 1951/04/11   PCP: Patient, No Pcp Per  Patient is from: Home.  Lives with her son.  Ambulates independently at baseline.  DOA: 06/30/2020 LOS: 2  Chief complaints: Chest pain  Brief Narrative / Interim history: 70 year old F with PMH of COPD and tobacco use disorder presented to ED with 24 hours of substernal chest pain associated with nausea, shortness of breath and diaphoresis.  She had markedly elevated troponin to 24,000 and admitted for non-STEMI. EKG with nonspecific ST and T wave changes but not acute.  LHC on 07/13/2020 showed 100% stenosis of proximal to mid-Cx, 60% stenosis of mid RCA and ost RCA to prox RCA and 40% stenosis of LA, and LVEF of 35 to 45%.  There was no stent placed due to late presentation.  Cardiology recommended medical management for now.  TTE with LVEF of 50 to 55%, RWMA, G2-DD  Subjective: Seen and examined earlier this morning.  None episode of desaturation to 70s on 5 L requiring nonrebreather.  ABG consistent with acute respiratory acidosis with hypercapnia.  His saturation improved, and he was saturating in upper 90s to 100% on 1 L by [place] this morning.  She endorses "some" substernal chest pain that she describes it as pressure.  Denies shortness of breath, GI or UTI symptoms.  No formal diagnosis of sleep apnea but reports snoring at night.  Objective: Vitals:   2020/07/04 0800 2020-07-04 0900 07-04-20 1000 Jul 04, 2020 1100  BP: 97/74 90/64 95/74  93/70  Pulse: (!) 102 98 92 98  Resp: (!) 21 15 15 20   Temp: 98 F (36.7 C)     TempSrc: Oral     SpO2: 100% 100% 98% 97%  Weight:      Height:        Intake/Output Summary (Last 24 hours) at 2020/07/04 1150 Last data filed at Jul 04, 2020 0800 Gross per 24 hour  Intake 1388.75 ml  Output 700 ml  Net 688.75 ml   Filed Weights   06/27/2020 1230 07/12/2020 1700 06/23/2020 1207  Weight: 72.6 kg 72.7 kg 72.7 kg    Examination:  GENERAL: No apparent distress.   Nontoxic. HEENT: MMM.  Vision and hearing grossly intact.  NECK: Supple.  No apparent JVD.  RESP: 98% on 1 L.  No IWOB.  Fair aeration bilaterally. CVS:  RRR. Heart sounds normal.  ABD/GI/GU: BS+. Abd soft, NTND.  MSK/EXT:  Moves extremities. No apparent deformity. No edema.  SKIN: no apparent skin lesion or wound NEURO: Awake but not quite alert.  Oriented x4.  No apparent focal neuro deficit. PSYCH: Calm. Normal affect.  Procedures:  07/07/2020-LHC on 06/18/2020 showed 100% stenosis of proximal to mid-Cx, 60% stenosis of mid RCA and ost RCA to prox RCA and 40% stenosis of LA, and LVEF of 35 to 45%.  There was no stent placed due to late presentation.  Cardiology recommended medical management for now.  TTE with LVEF of 50 to 55%, RWMA, G2-DD   Microbiology summarized: COVID-19 and influenza PCR nonreactive  Assessment & Plan: Acute respiratory failure with hypoxia and hypercapnia: Suspect this to be obstructive etiology from undiagnosed OSA versus COPD exacerbation given quick improvement in her oxygenation.  She reports snoring no formal diagnosis of sleep apnea.  Now saturating at 98% on 1 L without respiratory distress. -Wean oxygen as able.  Minimal oxygen to keep saturation above 90% -We will try nightly BiPAP -As needed DuoNeb -Avoid or minimize sedating medications such as morphine. -Prefer more cardioselective  beta-blocker for heart condition. -Needs outpatient sleep study.  Non-STEMI-markedly elevated troponin (24,000 to greater than 27,000).  LDL 95.  A1c 6.1%.  D-dimer negative.  LHC as above. No stent placed given late presentation.  TTE as above. -Medical management with Plavix, aspirin, beta-blocker and high intensity statin -Possible PCI in about 2 to 4 weeks  Acute diastolic CHF-TTE with LVEF of 50 to 55%, RWMA and G2-DD.  BNP in 300.  CXR consistent with mild CHF.  However, she does not appear fluid overloaded on exam.  I&O incomplete.  About 700 cc UOP charted.   Normotensive.  -On IV Lasix 40 mg daily per cardiology -Monitor fluid status, renal functions and electrolytes -Sodium and fluid restrictions  Leukocytosis: Likely demargination from stress response.  Improved. -Continue monitoring  Mild hyponatremia: Na 133. ?due to fluid overload.  Urine sodium elevated to 27 but obtained after Lasix. -Monitor  Chronic bronchitis /chronic COPD-no formal PFT in nature.  Does not seem to be on medication. -As needed nebulizers -Encouraged tobacco cessation -She would benefit from outpatient PFT and sleep study.  Tobacco abuse: Smokes about a pack a day.  About 60-pack-year history -encouraged cessation  -Nicotine patch  Class I obesity Body mass index is 31.3 kg/m. Nutrition Problem: Inadequate oral intake Etiology: acute illness Signs/Symptoms: per patient/family report     DVT prophylaxis:  enoxaparin (LOVENOX) injection 40 mg Start: 06/27/2020 1000  Code Status: Full code Family Communication: Updated patient's son and daughter-in-law over the phone. Level of care: Stepdown Status is: Inpatient  Remains inpatient appropriate because:Unsafe d/c plan, IV treatments appropriate due to intensity of illness or inability to take PO and Inpatient level of care appropriate due to severity of illness   Dispo: The patient is from: Home              Anticipated d/c is to: Home              Patient currently is not medically stable to d/c.   Difficult to place patient No       Consultants:  Cardiology   Sch Meds:  Scheduled Meds: . aspirin EC  81 mg Oral Daily  . atorvastatin  80 mg Oral Daily  . carvedilol  3.125 mg Oral BID WC  . Chlorhexidine Gluconate Cloth  6 each Topical Daily  . clopidogrel  75 mg Oral Q breakfast  . enoxaparin (LOVENOX) injection  40 mg Subcutaneous Q24H  . furosemide  40 mg Intravenous Daily  . LORazepam  1 mg Intravenous Once  . mouth rinse  15 mL Mouth Rinse BID  . nicotine  21 mg Transdermal Daily   . sodium chloride flush  3 mL Intravenous Q12H  . sodium chloride flush  3 mL Intravenous Q12H   Continuous Infusions: . sodium chloride    . nitroGLYCERIN Stopped (06/16/2020 0924)   PRN Meds:.sodium chloride, acetaminophen, ipratropium-albuterol, morphine injection, nitroGLYCERIN, ondansetron (ZOFRAN) IV, sodium chloride flush  Antimicrobials: Anti-infectives (From admission, onward)   None       I have personally reviewed the following labs and images: CBC: Recent Labs  Lab June 23, 2020 1235 06/24/2020 0611 07/02/2020 0436  WBC 17.0* 14.4* 13.1*  HGB 16.7* 15.1* 14.3  HCT 49.7* 46.3* 45.3  MCV 90.2 94.1 96.2  PLT 261 141* 177   BMP &GFR Recent Labs  Lab 06/23/2020 1326 06/22/2020 0611  NA 133* 133*  K 4.4 4.6  CL 95* 94*  CO2 28 29  GLUCOSE 121* 129*  BUN 14 18  CREATININE 0.61 0.62  CALCIUM 9.2 8.6*   Estimated Creatinine Clearance: 58.3 mL/min (by C-G formula based on SCr of 0.62 mg/dL). Liver & Pancreas: No results for input(s): AST, ALT, ALKPHOS, BILITOT, PROT, ALBUMIN in the last 168 hours. No results for input(s): LIPASE, AMYLASE in the last 168 hours. No results for input(s): AMMONIA in the last 168 hours. Diabetic: Recent Labs    07-13-20 2309  HGBA1C 6.1*   Recent Labs  Lab 06/14/2020 0732 07/09/2020 1117 07/14/2020 1615 07/09/2020 2151 06/25/2020 0727  GLUCAP 123* 112* 102* 92 101*   Cardiac Enzymes: No results for input(s): CKTOTAL, CKMB, CKMBINDEX, TROPONINI in the last 168 hours. No results for input(s): PROBNP in the last 8760 hours. Coagulation Profile: Recent Labs  Lab 06/28/2020 0611  INR 1.0   Thyroid Function Tests: Recent Labs    07/13/2020 2309  TSH 0.931   Lipid Profile: Recent Labs    07/04/2020 0611  CHOL 171  HDL 47  LDLCALC 95  TRIG 143  CHOLHDL 3.6   Anemia Panel: No results for input(s): VITAMINB12, FOLATE, FERRITIN, TIBC, IRON, RETICCTPCT in the last 72 hours. Urine analysis: No results found for: COLORURINE, APPEARANCEUR,  LABSPEC, PHURINE, GLUCOSEU, HGBUR, BILIRUBINUR, KETONESUR, PROTEINUR, UROBILINOGEN, NITRITE, LEUKOCYTESUR Sepsis Labs: Invalid input(s): PROCALCITONIN, LACTICIDVEN  Microbiology: Recent Results (from the past 240 hour(s))  Resp Panel by RT-PCR (Flu A&B, Covid) Nasopharyngeal Swab     Status: None   Collection Time: 13-Jul-2020  1:55 PM   Specimen: Nasopharyngeal Swab; Nasopharyngeal(NP) swabs in vial transport medium  Result Value Ref Range Status   SARS Coronavirus 2 by RT PCR NEGATIVE NEGATIVE Final    Comment: (NOTE) SARS-CoV-2 target nucleic acids are NOT DETECTED.  The SARS-CoV-2 RNA is generally detectable in upper respiratory specimens during the acute phase of infection. The lowest concentration of SARS-CoV-2 viral copies this assay can detect is 138 copies/mL. A negative result does not preclude SARS-Cov-2 infection and should not be used as the sole basis for treatment or other patient management decisions. A negative result may occur with  improper specimen collection/handling, submission of specimen other than nasopharyngeal swab, presence of viral mutation(s) within the areas targeted by this assay, and inadequate number of viral copies(<138 copies/mL). A negative result must be combined with clinical observations, patient history, and epidemiological information. The expected result is Negative.  Fact Sheet for Patients:  BloggerCourse.com  Fact Sheet for Healthcare Providers:  SeriousBroker.it  This test is no t yet approved or cleared by the [place] FDA and  has been authorized for detection and/or diagnosis of SARS-CoV-2 by FDA under an Emergency Use Authorization (EUA). This EUA will remain  in effect (meaning this test can be used) for the duration of the COVID-19 declaration under Section 564(b)(1) of the Act, 21 U.S.C.section 360bbb-3(b)(1), unless the authorization is terminated  or revoked sooner.        Influenza A by PCR NEGATIVE NEGATIVE Final   Influenza B by PCR NEGATIVE NEGATIVE Final    Comment: (NOTE) The Xpert Xpress SARS-CoV-2/FLU/RSV plus assay is intended as an aid in the diagnosis of influenza from Nasopharyngeal swab specimens and should not be used as a sole basis for treatment. Nasal washings and aspirates are unacceptable for Xpert Xpress SARS-CoV-2/FLU/RSV testing.  Fact Sheet for Patients: BloggerCourse.com  Fact Sheet for Healthcare Providers: SeriousBroker.it  This test is not yet approved or cleared by the [place] FDA and has been authorized for detection and/or diagnosis of SARS-CoV-2 by FDA under an Emergency  Use Authorization (EUA). This EUA will remain in effect (meaning this test can be used) for the duration of the COVID-19 declaration under Section 564(b)(1) of the Act, 21 U.S.C. section 360bbb-3(b)(1), unless the authorization is terminated or revoked.  Performed at [facility], [address] [place], [place] [address]   MRSA PCR Screening     Status: None   Collection Time: July 17, 2020  5:16 PM   Specimen: Nasal Mucosa; Nasopharyngeal  Result Value Ref Range Status   MRSA by PCR NEGATIVE NEGATIVE Final    Comment:        The GeneXpert MRSA Assay (FDA approved for NASAL specimens only), is one component of a comprehensive MRSA colonization surveillance program. It is not intended to diagnose MRSA infection nor to guide or monitor treatment for MRSA infections. Performed at [facility], [address], [place], [place] [address]     Radiology Studies: DG Chest 1 View  Result Date: 07/05/2020 CLINICAL DATA:  Shortness of breath. EXAM: CHEST  1 VIEW COMPARISON:  Radiograph yesterday. FINDINGS: Again seen hyperinflation and bronchial thickening. Bronchial thickening may have increased from prior exam. Interstitial thickening is also seen. No developing airspace  disease. Stable heart size and mediastinal contours. No pneumothorax or pleural effusion. IMPRESSION: Bronchial thickening and hyperinflation suggesting COPD. Bronchial thickening may have increased from yesterday. No developing airspace disease. Electronically Signed   By: Narda Rutherford M.D.   On: 06/28/2020 23:48   CARDIAC CATHETERIZATION  Result Date: 06/20/2020  Prox LAD to Mid LAD lesion is 40% stenosed.  Prox Cx to Mid Cx lesion is 100% stenosed.  The left ventricular ejection fraction is 35-45% by visual estimate.  There is moderate left ventricular systolic dysfunction.  LV end diastolic pressure is moderately elevated.  Ost RCA to Prox RCA lesion is 60% stenosed.  Mid RCA lesion is 60% stenosed.  1.  Significant two-vessel coronary artery disease with occluded mid left circumflex after the origin of a small OM1 branch.  There seems to be a large organized thrombus in the left circumflex and this occlusion seems to be more than 48 hours in duration.  In addition, there is borderline significant disease in ostial right coronary artery extending to the mid segment. 2.  Moderately reduced LV systolic function with an EF of 35 to 40% with significant inferior apical hypokinesis. 3.  Moderately elevated left ventricular end-diastolic pressure at 23 mmHg. Recommendations: Suspect that the left circumflex has been occluded for more than 48 hours correlating with onset of symptoms on Saturday morning.  Given that currently she is chest pain-free, there is no benefit from revascularizing the left circumflex at the present time. Recommend medical therapy for now.  I added clopidogrel. Recommend fractional flow reserve evaluation and possible PCI of the right coronary artery in 2 to 4 weeks once she recovers from her current myocardial infarction.   ECHOCARDIOGRAM COMPLETE  Result Date: 06/15/2020    ECHOCARDIOGRAM REPORT   Patient Name:   Brianna Barker Date of Exam: 06/14/2020 Medical Rec #:  [id number]      Height:       60.0 in Accession #:    [id number]    Weight:       160.3 lb Date of Birth:  08/07/1950     BSA:          1.699 m Patient Age:    70 years      BP:           97/74 mmHg  Patient Gender: F             HR:           102 bpm. Exam Location:  [facility] Procedure: 2D Echo, Cardiac Doppler, Color Doppler and Strain Analysis Indications:     NSTEMI I21.4  History:         Patient has no prior history of Echocardiogram examinations.                  Tobacco abuse.  Sonographer:     Cristela BlueJerry Hege RDCS (AE) Referring Phys:  [id number] Diagnosing Phys: Yvonne Kendallhristopher End MD  Sonographer Comments: Global longitudinal strain was attempted. IMPRESSIONS  1. Left ventricular ejection fraction, by estimation, is 50 to 55%. The left ventricle has low normal function. The left ventricle demonstrates regional wall motion abnormalities (see scoring diagram/findings for description). There is mild left ventricular hypertrophy. Left ventricular diastolic parameters are consistent with Grade II diastolic dysfunction (pseudonormalization). Elevated left atrial pressure. There is severe hypokinesis of the left ventricular, basal-mid inferolateral wall.  2. Right ventricular systolic function is normal. The right ventricular size is normal. There is mildly elevated pulmonary artery systolic pressure.  3. The mitral valve is abnormal. Mild to moderate mitral valve regurgitation. No evidence of mitral stenosis.  4. The aortic valve has an indeterminant number of cusps. There is mild calcification of the aortic valve. There is moderate thickening of the aortic valve. Aortic valve regurgitation is trivial. Mild aortic valve stenosis. Aortic valve mean gradient measures 14.0 mmHg.  5. The inferior vena cava is dilated in size with <50% respiratory variability, suggesting right atrial pressure of 15 mmHg. FINDINGS  Left Ventricle: Left ventricular ejection fraction, by estimation, is 50 to 55%. The left ventricle has low normal  function. The left ventricle demonstrates regional wall motion abnormalities. Severe hypokinesis of the left ventricular, basal-mid inferolateral wall. The left ventricular internal cavity size was normal in size. There is mild left ventricular hypertrophy. Left ventricular diastolic parameters are consistent with Grade II diastolic dysfunction (pseudonormalization). Elevated left atrial pressure. Right Ventricle: The right ventricular size is normal. No increase in right ventricular wall thickness. Right ventricular systolic function is normal. There is mildly elevated pulmonary artery systolic pressure. The tricuspid regurgitant velocity is 2.67  m/s, and with an assumed right atrial pressure of 15 mmHg, the estimated right ventricular systolic pressure is 43.5 mmHg. Left Atrium: Left atrial size was normal in size. Right Atrium: Right atrial size was normal in size. Pericardium: There is no evidence of pericardial effusion. Mitral Valve: The mitral valve is abnormal. There is mild thickening of the mitral valve leaflet(s). Mild to moderate mitral valve regurgitation. No evidence of mitral valve stenosis. Tricuspid Valve: The tricuspid valve is not well visualized. Tricuspid valve regurgitation is trivial. Aortic Valve: The aortic valve has an indeterminant number of cusps. There is mild calcification of the aortic valve. There is moderate thickening of the aortic valve. Aortic valve regurgitation is trivial. Mild aortic stenosis is present. Aortic valve mean gradient measures 14.0 mmHg. Aortic valve peak gradient measures 25.0 mmHg. Aortic valve area, by VTI measures 1.58 cm. Pulmonic Valve: The pulmonic valve was not well visualized. Pulmonic valve regurgitation is not visualized. Aorta: The aortic root is normal in size and structure. Pulmonary Artery: The pulmonary artery is of normal size. Venous: The inferior vena cava is dilated in size with less than 50% respiratory variability, suggesting right atrial  pressure of 15 mmHg. IAS/Shunts: The interatrial  septum was not well visualized.  LEFT VENTRICLE PLAX 2D LVIDd:         4.10 cm  Diastology LVIDs:         2.40 cm  LV e' medial:    6.64 cm/s LV PW:         1.16 cm  LV E/e' medial:  15.8 LV IVS:        0.97 cm  LV e' lateral:   5.11 cm/s LVOT diam:     2.00 cm  LV E/e' lateral: 20.5 LV SV:         65 LV SV Index:   38 LVOT Area:     3.14 cm  RIGHT VENTRICLE RV Basal diam:  2.91 cm RV S prime:     12.70 cm/s TAPSE (M-mode): 2.4 cm LEFT ATRIUM             Index       RIGHT ATRIUM           Index LA diam:        3.60 cm 2.12 cm/m  RA Area:     10.80 cm LA Vol (A2C):   64.0 ml 37.67 ml/m RA Volume:   23.80 ml  14.01 ml/m LA Vol (A4C):   38.3 ml 22.54 ml/m LA Biplane Vol: 51.4 ml 30.25 ml/m  AORTIC VALVE                    PULMONIC VALVE AV Area (Vmax):    1.27 cm     PV Vmax:        0.50 m/s AV Area (Vmean):   1.16 cm     PV Peak grad:   1.0 mmHg AV Area (VTI):     1.58 cm     RVOT Peak grad: 6 mmHg AV Vmax:           250.00 cm/s AV Vmean:          173.667 cm/s AV VTI:            0.411 m AV Peak Grad:      25.0 mmHg AV Mean Grad:      14.0 mmHg LVOT Vmax:         101.00 cm/s LVOT Vmean:        64.200 cm/s LVOT VTI:          0.207 m LVOT/AV VTI ratio: 0.50  AORTA Ao Root diam: 2.43 cm MITRAL VALVE                TRICUSPID VALVE MV Area (PHT): 7.09 cm     TR Peak grad:   28.5 mmHg MV Decel Time: 107 msec     TR Vmax:        267.00 cm/s MV E velocity: 105.00 cm/s MV A velocity: 104.00 cm/s  SHUNTS MV E/A ratio:  1.01         Systemic VTI:  0.21 m                             Systemic Diam: 2.00 cm Yvonne Kendall MD Electronically signed by Yvonne Kendall MD Signature Date/Time: 06/26/20/10:07:16 AM    Final       Gergory Biello T. Marlon Suleiman [facility]  If 7PM-7AM, please contact night-coverage [email or web address] 06/26/2020, 11:50 AM

## 2020-07-15 NOTE — Consult Note (Signed)
NAMEZhuri Barker, MRN:  [id number], DOB:  [birth year], LOS: 2 ADMISSION DATE:  06-24-2020, CONSULTATION DATE:  06/15/2020 REFERRING MD:  Alanda Slim, CHIEF COMPLAINT:  Intermittent hypoxia  Brief History:  This is a 70 yo female with medical history of chronic bronchitis, tobacco abuse, and heart failure.  Presented with chest pressure and nausea that had been going on for several days.  Also having poor appetite.  Found to have a STEMI.  History of Present Illness:  This is a 70 yo female with medical history of chronic bornchitis, tobacco abuse, and heart failure.  Presented with chest pressure and nausea that had been going on for several days.  Also having poor appetite.  Found to have a STEMI.  She was taken to the Cath Lab.  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  As well as moderate ostial and mid RCA disease.  EF was low normal with noted inferior lateral hypokinesis and mild to moderate mitral regurgitation.  Also during this admission she is noted to have episodes of 5 to 10 minutes where she will become acutely hypoxic.  With sats in the 60s requiring nonrebreather.  Also during these episodes she is noted to become hypoxic.  She does not become bradycardic.  She has had marginal blood pressures throughout the day.  She has received 2 doses of Lasix throughout the day on 07/12/2020.  Patient notes that she feels nauseous.  She also has some dizziness when sitting up.  Occasional chest pressure   Past Medical History:  Chronic bronchitis and COPD Tobacco abuse Heart failure  Significant Hospital Events:  Patient transferred to ICU on 07/02/2020 for higher level of care  Consults:  PCCM Cardiology  Procedures:  Central line placed on 07/03/2020  Significant Diagnostic Tests:  Echo on 07/09/2020  1. Left ventricular ejection fraction, by estimation, is 50 to 55%. The  left ventricle has low normal function. The left ventricle demonstrates  regional  wall motion abnormalities (see scoring diagram/findings for  description). There is mild left  ventricular hypertrophy. Left ventricular diastolic parameters are  consistent with Grade II diastolic dysfunction (pseudonormalization).  Elevated left atrial pressure. There is severe hypokinesis of the left  ventricular, basal-mid inferolateral wall.  2. Right ventricular systolic function is normal. The right ventricular  size is normal. There is mildly elevated pulmonary artery systolic  pressure.  3. The mitral valve is abnormal. Mild to moderate mitral valve  regurgitation. No evidence of mitral stenosis.  4. The aortic valve has an indeterminant number of cusps. There is mild  calcification of the aortic valve. There is moderate thickening of the  aortic valve. Aortic valve regurgitation is trivial. Mild aortic valve  stenosis. Aortic valve mean gradient  measures 14.0 mmHg.  5. The inferior vena cava is dilated in size with <50% respiratory  variability, suggesting right atrial pressure of 15 mmHg.   Micro Data:  Covid and flu negative  Antimicrobials:  Not indicated  Interim History / Subjective:  Patient with continued symptoms  Objective   Blood pressure 97/71, pulse 100, temperature (!) 97.2 F (36.2 C), temperature source Oral, resp. rate (!) 22, height 5' (1.524 m), weight 72.7 kg, SpO2 100 %.    FiO2 (%):  [30 %-35 %] 30 %   Intake/Output Summary (Last 24 hours) at 07/12/2020 1901 Last data filed at 07/03/2020 1824 Gross per 24 hour  Intake 800 ml  Output 1300 ml  Net -500 ml  Filed Weights   07/10/2020 1230 07/13/2020 1700 07-01-20 1207  Weight: 72.6 kg 72.7 kg 72.7 kg    Examination: General: Patient appears uncomfortable but in no acute distress HENT: Moist mucous membranes Lungs: Tachypneic with minimal increased work of breathing no wheezes appreciated Cardiovascular: Tachycardic with systolic murmur Abdomen: Soft nondistended minimal  tenderness Extremities: 1+ edema Neuro: Alert and oriented x4 GU: Foley in place  Resolved Hospital Problem list   N/A  Assessment & Plan:  This is a 70 year old female with history as noted above who presents for chest pain and nausea noted to have ST segment elevation MI.   Hypoxic respiratory failure-likely secondary to volume overload.  Patient at risk of going into cardiogenic shock.  Echo does not note reduced EF however on bedside ultrasound patient with significantly decreased inferior wall motion.  Could also have component of underlying COPD that is at least contributing -Agree with diuresis.  May consider Lasix infusion if patient does not respond to current therapy -Dobutamine as inotrope of choice for now -Follow urine output throughout the night -Vasopressin if blood pressure drops -Heparin infusion to medically manage possible reinfarction or worsening infarction -Trend lactates -Trend CVP -Scheduled nebulized medications -Do not think patient in COPD exacerbation given she does not have sputum production thus no steroids or antibiotics -Okay to start inhaler in future if continues to have symptoms -Would repeat limited echo in the morning -If volume status optimized still having the symptoms would have low threshold to obtain CTA chest and abdomen           Best practice (evaluated daily)  Diet: N.p.o. for now Pain/Anxiety/Delirium protocol (if indicated): Not indicated VAP protocol (if indicated): Not indicated DVT prophylaxis: Heparin infusion GI prophylaxis: Not indicated Glucose control: FSBS every 4 while NPO Mobility: Bedrest for now Disposition: ICU  Goals of Care:  Last date of multidisciplinary goals of care discussion: Have updated son Family and staff present: Updated bedside nurse Summary of discussion: Patient will be medically optimized for myocardial infarction and subsequent heart failure Follow up goals of care discussion due:  06-22-20 Code Status: Patient wishes to remain full code  Labs   CBC: Recent Labs  Lab 07/04/2020 1235 07/01/20 0611 06/29/2020 0436  WBC 17.0* 14.4* 13.1*  HGB 16.7* 15.1* 14.3  HCT 49.7* 46.3* 45.3  MCV 90.2 94.1 96.2  PLT 261 141* 177    Basic Metabolic Panel: Recent Labs  Lab 07/04/2020 1326 2020-07-01 0611 07/10/2020 1252  NA 133* 133* 132*  K 4.4 4.6 4.4  CL 95* 94* 90*  CO2 28 29 33*  GLUCOSE 121* 129* 102*  BUN 14 18 29*  CREATININE 0.61 0.62 0.66  CALCIUM 9.2 8.6* 8.7*  MG  --   --  1.8  PHOS  --   --  3.5   GFR: Estimated Creatinine Clearance: 58.3 mL/min (by C-G formula based on SCr of 0.66 mg/dL). Recent Labs  Lab 07/06/2020 1235 2020/07/01 0611 07/02/2020 0436 06/23/2020 1630  WBC 17.0* 14.4* 13.1*  --   LATICACIDVEN  --   --   --  1.6    Liver Function Tests: Recent Labs  Lab 06/23/2020 1252  ALBUMIN 3.5   No results for input(s): LIPASE, AMYLASE in the last 168 hours. Recent Labs  Lab 06/28/2020 1644  AMMONIA 24    ABG    Component Value Date/Time   PHART 7.27 (L) 07-01-2020 2325   PCO2ART 83 (HH) 07/01/2020 2325   PO2ART 265 (H) 07/01/20 2325  HCO3 41.3 (H) 2020-07-11 1630   O2SAT 57.0 July 11, 2020 1630     Coagulation Profile: Recent Labs  Lab 06/16/2020 0611  INR 1.0    Cardiac Enzymes: No results for input(s): CKTOTAL, CKMB, CKMBINDEX, TROPONINI in the last 168 hours.  HbA1C: Hgb A1c MFr Bld  Date/Time Value Ref Range Status  06/22/2020 11:09 PM 6.1 (H) 4.8 - 5.6 % Final    Comment:    (NOTE) Pre diabetes:          5.7%-6.4%  Diabetes:              >6.4%  Glycemic control for   <7.0% adults with diabetes     CBG: Recent Labs  Lab 06/30/2020 1615 07/03/2020 2151 07/11/2020 0727 07-11-20 1155 07-11-20 1623  GLUCAP 102* 92 101* 91 95    Review of Systems:   No fevers or chills  Past Medical History:  She,  has a past medical history of Chronic bronchitis (HCC), Diverticulitis, Morbid obesity (HCC), and Tobacco abuse.    Surgical History:   Past Surgical History:  Procedure Laterality Date  . ABDOMINAL HYSTERECTOMY    . LEFT HEART CATH AND CORONARY ANGIOGRAPHY N/A 07/05/2020   Procedure: LEFT HEART CATH AND CORONARY ANGIOGRAPHY;  Surgeon: Iran Ouch, MD;  Location: [facility] INVASIVE CV LAB;  Service: Cardiovascular;  Laterality: N/A;  . TONSILLECTOMY       Social History:   reports that she has been smoking cigarettes. She has a 40.00 pack-year smoking history. She has never used smokeless tobacco. She reports previous alcohol use. She reports that she does not use drugs.   Family History:  Her family history includes Diabetes in her mother; Heart attack in her brother and father; Heart disease in her mother; Peripheral Artery Disease in her mother.   Allergies Allergies  Allergen Reactions  . Aleve [Naproxen] Itching and Rash     Home Medications  Prior to Admission medications   Medication Sig Start Date End Date Taking? Authorizing Provider  furosemide (LASIX) 20 MG tablet Take 1 tablet (20 mg total) by mouth daily for 10 days. 08/17/19 08/27/19  Dionne Bucy, MD     Critical care time: 50 minutes

## 2020-07-15 NOTE — Progress Notes (Signed)
*  PRELIMINARY RESULTS* Echocardiogram 2D Echocardiogram has been performed.  Brianna Barker 07-10-20, 9:30 AM

## 2020-07-15 NOTE — Progress Notes (Addendum)
    BRIEF OVERNIGHT PROGRESS REPORT  SUBJECTIVE: Per RN sats dropped to 79% on 5L [place] tachycardic in the upper 120s with associated nausea and restlessness.  OBJECTIVE:On arrival to the ED, he was afebrile with blood pressure 100/82 mm Hg and pulse rate 109 beats/min. There were no focal neurological deficits; she was alert and oriented x4, but appeared restless and uncomfortable.  ASSESSMENT & PLAN:  -Acute Hypoxic Hypercapnic Respiratory Failure secondary to Acute CHF and non -STEMI -COPD without evidence of acute exacerbation -Supplemental O2 as needed to maintain O2 saturations 88 to 92% -BiPAP, wean as tolerated -High risk for intubation -ABG demonstrate severe respiratory acidosis. Follow intermittent ABG and chest x-ray as needed -Repeat CXR with stable vascular congestion; no new focal infiltrate, hyperinflation noted; -IV Lasix as blood pressure and renal function permits; currently Lasix on hold -As needed bronchodilators -Encourage smoking cessation      Webb Silversmith, BSN, MSN, DNP, CCRN,FNP-C  [facility] Nurse Practitioner  [facility]

## 2020-07-15 NOTE — Progress Notes (Signed)
1900- At patient bedside, patient awake and alert. C/O nausea and not feeling well. Patient pulled NRB off SATs to 83%. NRB reapplied PO2 sats increased to 90's. While at patients bedside, this RN noted EKG change with elevated ST. Patient color became grayish/purple, and her eyes rolled back. Unable to palpate a pulse, CPR started and code blue called. See code sheet and NP note.

## 2020-07-15 DEATH — deceased

## 2021-07-26 IMAGING — DX DG CHEST 1V PORT
1 series · 1 of 1 positions shown · non-contrast
Comparison: Film from earlier in the same day.

CLINICAL DATA: Check central line placement

EXAM:
PORTABLE CHEST 1 VIEW

[chest ap]
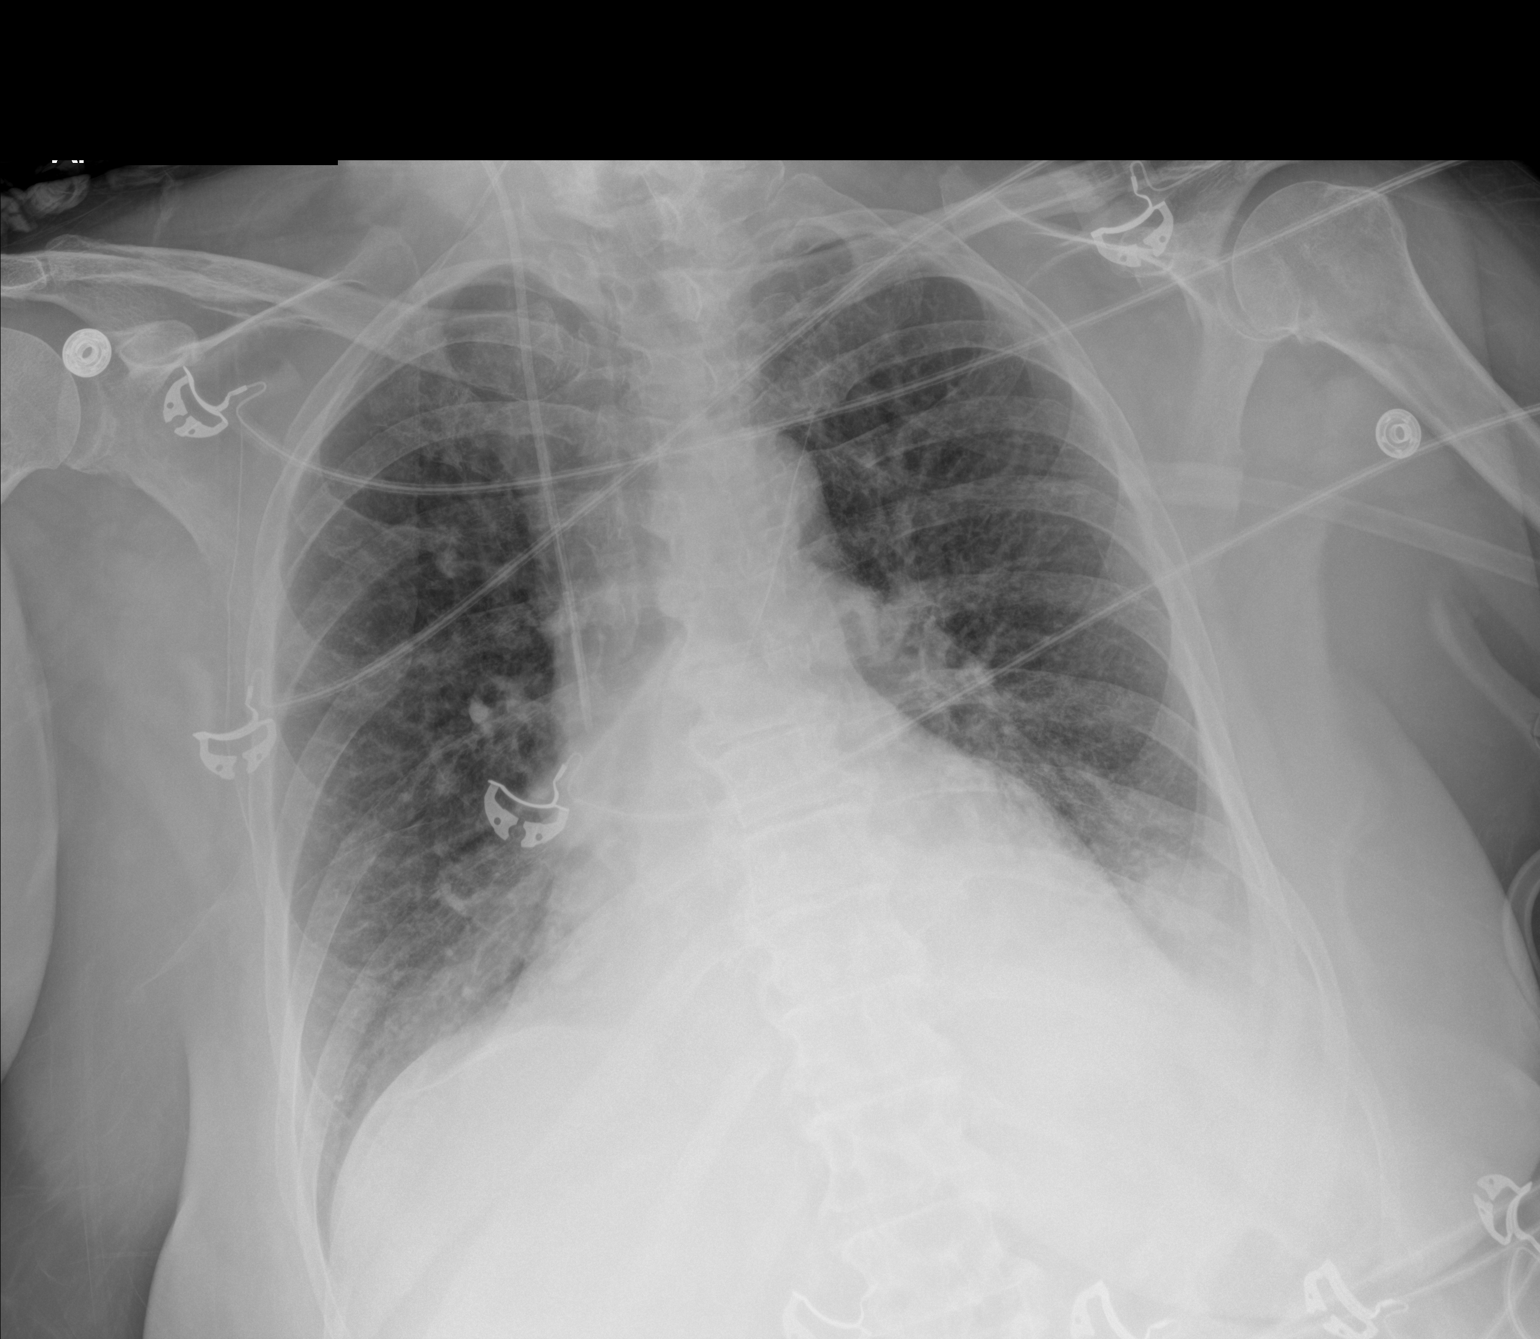

[1 of 1 positions shown; findings below may reference images not displayed]

FINDINGS: New right jugular central line is noted with catheter tip in the mid
superior vena cava. No pneumothorax is seen. Cardiac shadow is
stable. Small effusions are again noted. Mild central vascular
congestion is again seen.
IMPRESSION: Mild changes of CHF. No pneumothorax following new central line
placement.
